# Patient Record
Sex: Male | Born: 1980 | Race: White | Hispanic: No | Marital: Married | Smoking: Never smoker
Health system: Southern US, Community
[De-identification: ages and names within clinical notes are randomized; demographics above are authoritative.]

## PROBLEM LIST (undated history)

## (undated) ENCOUNTER — Emergency Department (HOSPITAL_COMMUNITY): Admission: EM | Payer: PRIVATE HEALTH INSURANCE

## (undated) DIAGNOSIS — G43909 Migraine, unspecified, not intractable, without status migrainosus: Secondary | ICD-10-CM

## (undated) DIAGNOSIS — S8990XA Unspecified injury of unspecified lower leg, initial encounter: Secondary | ICD-10-CM

## (undated) DIAGNOSIS — I10 Essential (primary) hypertension: Secondary | ICD-10-CM

## (undated) DIAGNOSIS — I639 Cerebral infarction, unspecified: Secondary | ICD-10-CM

## (undated) DIAGNOSIS — F419 Anxiety disorder, unspecified: Secondary | ICD-10-CM

## (undated) HISTORY — DX: Essential (primary) hypertension: I10

## (undated) HISTORY — PX: OTHER SURGICAL HISTORY: SHX169

---

## 2006-06-23 ENCOUNTER — Ambulatory Visit (HOSPITAL_COMMUNITY): Payer: Self-pay | Admitting: Psychiatry

## 2006-12-15 ENCOUNTER — Ambulatory Visit (HOSPITAL_COMMUNITY): Payer: Self-pay | Admitting: Psychiatry

## 2007-05-25 ENCOUNTER — Ambulatory Visit (HOSPITAL_COMMUNITY): Payer: Self-pay | Admitting: Psychiatry

## 2008-02-29 ENCOUNTER — Ambulatory Visit (HOSPITAL_COMMUNITY): Payer: Self-pay | Admitting: Psychiatry

## 2008-08-01 ENCOUNTER — Ambulatory Visit (HOSPITAL_COMMUNITY): Payer: Self-pay | Admitting: Psychiatry

## 2009-04-24 ENCOUNTER — Ambulatory Visit (HOSPITAL_COMMUNITY): Payer: Self-pay | Admitting: Psychiatry

## 2009-07-10 ENCOUNTER — Ambulatory Visit (HOSPITAL_COMMUNITY): Payer: Self-pay | Admitting: Psychiatry

## 2009-10-09 ENCOUNTER — Ambulatory Visit (HOSPITAL_COMMUNITY): Payer: Self-pay | Admitting: Psychiatry

## 2011-05-14 ENCOUNTER — Ambulatory Visit: Payer: BC Managed Care – PPO

## 2012-07-11 ENCOUNTER — Ambulatory Visit (INDEPENDENT_AMBULATORY_CARE_PROVIDER_SITE_OTHER): Payer: BC Managed Care – PPO | Admitting: Internal Medicine

## 2012-07-11 ENCOUNTER — Ambulatory Visit: Payer: BC Managed Care – PPO

## 2012-07-11 VITALS — BP 166/100 | HR 76 | Temp 98.5°F | Resp 16 | Ht 66.5 in | Wt 189.6 lb

## 2012-07-11 DIAGNOSIS — M25512 Pain in left shoulder: Secondary | ICD-10-CM

## 2012-07-11 DIAGNOSIS — M62838 Other muscle spasm: Secondary | ICD-10-CM

## 2012-07-11 DIAGNOSIS — M25519 Pain in unspecified shoulder: Secondary | ICD-10-CM

## 2012-07-11 DIAGNOSIS — I10 Essential (primary) hypertension: Secondary | ICD-10-CM | POA: Insufficient documentation

## 2012-07-11 MED ORDER — HYDROCODONE-ACETAMINOPHEN 5-325 MG PO TABS: 1.0000 | ORAL_TABLET | Freq: Four times a day (QID) | ORAL | Status: DC | PRN

## 2012-07-11 MED ORDER — MELOXICAM 7.5 MG PO TABS: 7.5000 mg | ORAL_TABLET | Freq: Every day | ORAL | Status: DC

## 2012-07-11 MED ORDER — CYCLOBENZAPRINE HCL 5 MG PO TABS: 5.0000 mg | ORAL_TABLET | Freq: Three times a day (TID) | ORAL | Status: DC | PRN

## 2012-07-11 NOTE — Progress Notes (Signed)
   [address] [place] [address]   Phone [phone]  Subjective:    Patient ID: Edwin Roberson, male    DOB: 11/07/1980, 32 y.o.   MRN: [id number]  HPI  Pt presents to clinic with 2 day h/o shoulder pain.  He was playing dodge ball and threw the ball hard many times. Now he has pain with movement of his shoulder and when he relaxes his neck/shoulder muscles he feels like his shoulder is slipping.  Most of his pain is anterior shoulder.  Though he can move his shoulder he has a lot of pain with movement.  He has no distal paresthesias or pain.  He does have some pain in the upper part of his bicep/deltoid area.  He has noticed that the muscles in his neck and shoulder area have gotten more painful.  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  He is L handed.    Review of Systems  Musculoskeletal: Positive for myalgias. Negative for joint swelling.       Objective:   Physical Exam  Vitals reviewed. Constitutional: He is oriented to person, place, and time. He appears well-developed and well-nourished.  Pulmonary/Chest: Effort normal.  Musculoskeletal:       Left shoulder: He exhibits decreased range of motion (Slight decrease with abduction and internal rotation - pt has pain with end points of ROM), tenderness, bony tenderness, pain (anterior shoulder), spasm (trap on the L muscle spasm and TTP) and decreased strength (2nd to pain). He exhibits no swelling and normal pulse.  Neurological: He is alert and oriented to person, place, and time.  Skin: Skin is warm and dry.  Psychiatric: He has a normal mood and affect. His behavior is normal. Judgment and thought content normal.   [facility] reading (PRIMARY) by  Dr. Merla Riches. Neg      Assessment & Plan:  Pain in joint, shoulder region, left - Pt's exam is difficult due to his pain.  Will place in a sling to reduce his strain on his trap muscles and then recheck on Friday in hopes that we an  do a better exam.  I think that patient either has a significant strain or a partial rotator cuff tear.  Plan: DG Shoulder Left, meloxicam (MOBIC) 7.5 MG tablet, HYDROcodone-acetaminophen (NORCO/VICODIN) 5-325 MG per tablet  Muscle spasm - Plan: cyclobenzaprine (FLEXERIL) 5 MG tablet  He will do heat on his muscles and ice on his shoulder.    I have reviewed and agree with documentation. Robert P. Merla Riches, M.D.

## 2012-07-19 ENCOUNTER — Telehealth: Payer: Self-pay

## 2012-07-19 NOTE — Telephone Encounter (Signed)
PT STATES HE HAS SEEN SARAH FOR HIS SHOULDER AND IT IS STILL GIVING HIM A LOT OF PAIN. ALSO THE PAIN MEDICINE HE WAS GIVEN ISN'T HELPING AND WOULD LIKE TO HAVE SOMETHING ELSE CALLED IN. PLEASE CALL [phone]   [facility] ON [address]

## 2012-07-19 NOTE — Telephone Encounter (Signed)
Hydrocodone not helping shoulder pain

## 2012-07-20 ENCOUNTER — Telehealth: Payer: Self-pay | Admitting: Radiology

## 2012-07-20 DIAGNOSIS — M25512 Pain in left shoulder: Secondary | ICD-10-CM

## 2012-07-20 NOTE — Telephone Encounter (Signed)
Called patient to advise. He is asked if he wants ortho appt or to follow up here. He will come back in here this afternoon.

## 2012-07-20 NOTE — Telephone Encounter (Signed)
Patient called back, he wants to see Ortho at [facility]. Referral made.

## 2012-07-20 NOTE — Telephone Encounter (Signed)
I think a recheck would be best or a referral to ortho.

## 2012-11-03 ENCOUNTER — Other Ambulatory Visit (HOSPITAL_COMMUNITY): Payer: Self-pay | Admitting: Specialist

## 2012-11-03 DIAGNOSIS — M79662 Pain in left lower leg: Secondary | ICD-10-CM

## 2012-11-03 DIAGNOSIS — M7989 Other specified soft tissue disorders: Secondary | ICD-10-CM

## 2012-11-04 ENCOUNTER — Ambulatory Visit (HOSPITAL_COMMUNITY)
Admission: RE | Admit: 2012-11-04 | Discharge: 2012-11-04 | Disposition: A | Discharge status: Home / Self Care | Payer: BC Managed Care – PPO | Source: Ambulatory Visit | Attending: Specialist | Admitting: Specialist

## 2012-11-04 DIAGNOSIS — M7989 Other specified soft tissue disorders: Secondary | ICD-10-CM | POA: Insufficient documentation

## 2012-11-04 DIAGNOSIS — M79662 Pain in left lower leg: Secondary | ICD-10-CM

## 2012-11-04 DIAGNOSIS — M79609 Pain in unspecified limb: Secondary | ICD-10-CM

## 2012-11-04 NOTE — Progress Notes (Signed)
*  PRELIMINARY RESULTS* Vascular Ultrasound Left lower extremity venous duplex has been completed.  Preliminary findings: No evidence of DVT or SVT involving the left lower extremity and right common femoral vein. No evidence of Baker's cyst on the left. Inflamed area of the left LSV localized to the distal calf.   Timarion Agcaoili FRANCES 11/04/2012, 4:24 PM

## 2013-05-08 ENCOUNTER — Emergency Department (HOSPITAL_COMMUNITY)
Admission: EM | Admit: 2013-05-08 | Discharge: 2013-05-08 | Disposition: A | Discharge status: Home / Self Care | Payer: BC Managed Care – PPO | Source: Home / Self Care

## 2013-05-08 ENCOUNTER — Encounter (HOSPITAL_COMMUNITY): Payer: Self-pay | Admitting: Emergency Medicine

## 2013-05-08 DIAGNOSIS — K625 Hemorrhage of anus and rectum: Secondary | ICD-10-CM

## 2013-05-08 DIAGNOSIS — K5900 Constipation, unspecified: Secondary | ICD-10-CM

## 2013-05-08 DIAGNOSIS — R1084 Generalized abdominal pain: Secondary | ICD-10-CM

## 2013-05-08 NOTE — ED Provider Notes (Signed)
CSN: [id number]     Arrival date & time 05/08/13  1226 History   First MD Initiated Contact with Patient 05/08/13 1517     No chief complaint on file.  (Consider location/radiation/quality/duration/timing/severity/associated sxs/prior Treatment) HPI Comments: 32 year old male presents with blood in his stool upon wiping for the past 2 days. He has not had a good bowel movement in at least 2 days. He has been straining with a bowel movement and upon straining has seen a small amount of bright red bleeding. He does not have bleeding between stools. This is associated with intermittent abdominal pain primarily to the left and right abdomen. Not so much in the midline. No vomiting, fever, chills or diarrhea.   Past Medical History  Diagnosis Date  . Hypertension    Past Surgical History  Procedure Laterality Date  . Posterior cruciate ligament reconstruction     No family history on file. History  Substance Use Topics  . Smoking status: Never Smoker   . Smokeless tobacco: Not on file  . Alcohol Use: Not on file    Review of Systems  Constitutional: Negative.   HENT: Negative.   Respiratory: Negative.   Gastrointestinal: Positive for abdominal pain, constipation, blood in stool and anal bleeding. Negative for nausea, diarrhea and abdominal distention.       Angle irritation particularly associated with wiping post-BM.  Genitourinary: Negative.   Skin: Negative for rash.    Allergies  Review of patient's allergies indicates no known allergies.  Home Medications   Current Outpatient Rx  Name  Route  Sig  Dispense  Refill  . cyclobenzaprine (FLEXERIL) 5 MG tablet   Oral   Take 1 tablet (5 mg total) by mouth 3 (three) times daily as needed for muscle spasms.   30 tablet   0   . HYDROcodone-acetaminophen (NORCO/VICODIN) 5-325 MG per tablet   Oral   Take 1 tablet by mouth every 6 (six) hours as needed for pain.   30 tablet   0   . meloxicam (MOBIC) 7.5 MG tablet   Oral  Take 1 tablet (7.5 mg total) by mouth daily.   30 tablet   0   . valsartan-hydrochlorothiazide (DIOVAN-HCT) 320-25 MG per tablet   Oral   Take 1 tablet by mouth daily.         Marland Kitchen zolpidem (AMBIEN) 10 MG tablet   Oral   Take 10 mg by mouth at bedtime as needed for sleep.          BP 155/112  Pulse 73  Temp(Src) 98.7 F (37.1 C) (Oral)  Resp 16  SpO2 100% Physical Exam  Nursing note and vitals reviewed. Constitutional: He is oriented to person, place, and time. He appears well-developed and well-nourished. No distress.  Neck: Normal range of motion. Neck supple.  Cardiovascular: Normal rate and regular rhythm.   Pulmonary/Chest: Effort normal and breath sounds normal. No respiratory distress.  Abdominal: Soft. He exhibits no distension.  Minor tenderness along the ascending and descending colon. Palpation reveals small masses suspected of being fecal matter. His abdomen is flat and easily palpated. Percussion reveals dull and flat areas. There is no tympany.  Genitourinary: Prostate normal. Guaiac positive stool.  Very minor and light erythema to the perianal tissues. DRE reveals no palpable masses or irregular structures. Patient denies pain associated with the exam. No stool in the rectal vault. The rectal mucus associated with the exam was tested for blood and was grossly positive.  Musculoskeletal: He exhibits no  edema.  Neurological: He is alert and oriented to person, place, and time. A cranial nerve deficit is present. He exhibits normal muscle tone.  Skin: Skin is warm and dry.  Psychiatric: He has a normal mood and affect.    ED Course  Procedures (including critical care time) Labs Review Labs Reviewed - No data to display Imaging Review No results found.    MDM   1. Abdominal pain, generalized   2. Constipation   3. Bright red rectal bleeding      No acute abdomen. Dulcolax  as directed this afternoon and start MiraLax at least 3 glasses of 17 g each  over the next 3 hours. Drink plenty of fluids. Increase fiber content in her diet. Followup with your PCP. For any symptoms problems or worsening sig medical attention promptly.  Hayden Rasmussen, NP 05/08/13 1547  Hayden Rasmussen, NP 05/08/13 1550

## 2013-05-09 NOTE — ED Provider Notes (Signed)
Medical screening examination/treatment/procedure(s) were performed by non-physician practitioner and as supervising physician I was immediately available for consultation/collaboration.  Kolleen Ochsner, M.D.  Sherronda Sweigert C Jovan Colligan, MD 05/09/13 0035 

## 2013-05-22 ENCOUNTER — Ambulatory Visit: Payer: BC Managed Care – PPO

## 2013-05-22 ENCOUNTER — Ambulatory Visit (INDEPENDENT_AMBULATORY_CARE_PROVIDER_SITE_OTHER): Payer: BC Managed Care – PPO | Admitting: Family Medicine

## 2013-05-22 VITALS — BP 147/90 | HR 76 | Temp 98.7°F | Resp 16 | Ht 68.0 in | Wt 193.0 lb

## 2013-05-22 DIAGNOSIS — R0781 Pleurodynia: Secondary | ICD-10-CM

## 2013-05-22 DIAGNOSIS — W102XXA Fall (on)(from) incline, initial encounter: Secondary | ICD-10-CM

## 2013-05-22 DIAGNOSIS — R079 Chest pain, unspecified: Secondary | ICD-10-CM

## 2013-05-22 DIAGNOSIS — M25519 Pain in unspecified shoulder: Secondary | ICD-10-CM

## 2013-05-22 DIAGNOSIS — S29011A Strain of muscle and tendon of front wall of thorax, initial encounter: Secondary | ICD-10-CM

## 2013-05-22 DIAGNOSIS — IMO0002 Reserved for concepts with insufficient information to code with codable children: Secondary | ICD-10-CM

## 2013-05-22 DIAGNOSIS — M25512 Pain in left shoulder: Secondary | ICD-10-CM

## 2013-05-22 MED ORDER — HYDROCODONE-ACETAMINOPHEN 5-325 MG PO TABS: 1.0000 | ORAL_TABLET | Freq: Four times a day (QID) | ORAL | Status: DC | PRN

## 2013-05-22 NOTE — Patient Instructions (Signed)
Take Aleve 2 pills twice daily breakfast and supper  Take the hydrocodone if needed for severe pain  Return if not improving or if worse  Apply heat or warm compresses or a warm bath for some symptomatic relief

## 2013-05-22 NOTE — Progress Notes (Signed)
Subjective: Patient was helping his father moved 2 days ago. He took a short cut down a bank,, his feet slipped out from under him, he hit backwards hard to, and bounced back up to a standing position because of the incline he was on. He felt okay at that point. As the day went on he continued to develop more pain in the right posterior ribs and around to his side. It seems to start almost in the spine and moved to the right. Hurt his left ribs years ago, but this was something new. He is currently unemployed, having been laid off a couple weeks ago.  Objective: Alert and oriented. Mildly overweight. Chest clear. Mild tenderness on lower thoracic spine. Tender right posterior rib cage about 3 inches out from the spine seems to be the area of most tenderness. There is some tenderness around the right chest wall. Abdomen soft without mass or tenderness. Range of motion of his back is good though he gets more pain with tilting of the right or truncal rotation strain right chest wall. Adequate flexion though it hurt some when he was standing back up.  Assessment: Chest wall pain Fall on incline, initial visit  Plan: Right rib series  [facility] reading (PRIMARY) by  Dr. Alwyn RenHopper Normal ribs  Chest wall strain. .  Will treat with pain medication and anti-inflammatory medication

## 2013-10-09 ENCOUNTER — Encounter (HOSPITAL_COMMUNITY): Payer: Self-pay | Admitting: Emergency Medicine

## 2013-10-09 ENCOUNTER — Emergency Department (HOSPITAL_COMMUNITY)
Admission: EM | Admit: 2013-10-09 | Discharge: 2013-10-09 | Disposition: A | Discharge status: Home / Self Care | Payer: BC Managed Care – PPO | Attending: Emergency Medicine | Admitting: Emergency Medicine

## 2013-10-09 ENCOUNTER — Emergency Department (HOSPITAL_COMMUNITY): Payer: BC Managed Care – PPO

## 2013-10-09 DIAGNOSIS — F419 Anxiety disorder, unspecified: Secondary | ICD-10-CM

## 2013-10-09 DIAGNOSIS — Z760 Encounter for issue of repeat prescription: Secondary | ICD-10-CM

## 2013-10-09 DIAGNOSIS — I10 Essential (primary) hypertension: Secondary | ICD-10-CM | POA: Insufficient documentation

## 2013-10-09 DIAGNOSIS — Z79899 Other long term (current) drug therapy: Secondary | ICD-10-CM | POA: Insufficient documentation

## 2013-10-09 DIAGNOSIS — R110 Nausea: Secondary | ICD-10-CM | POA: Insufficient documentation

## 2013-10-09 DIAGNOSIS — F411 Generalized anxiety disorder: Secondary | ICD-10-CM | POA: Insufficient documentation

## 2013-10-09 DIAGNOSIS — R197 Diarrhea, unspecified: Secondary | ICD-10-CM | POA: Insufficient documentation

## 2013-10-09 HISTORY — DX: Anxiety disorder, unspecified: F41.9

## 2013-10-09 LAB — COMPREHENSIVE METABOLIC PANEL
ALK PHOS: 47 U/L (ref 39–117)
ALT: 37 U/L (ref 0–53)
AST: 20 U/L (ref 0–37)
Albumin: 4.4 g/dL (ref 3.5–5.2)
BUN: 12 mg/dL (ref 6–23)
CALCIUM: 9.8 mg/dL (ref 8.4–10.5)
CO2: 23 mEq/L (ref 19–32)
Chloride: 98 mEq/L (ref 96–112)
Creatinine, Ser: 1.02 mg/dL (ref 0.50–1.35)
GFR calc non Af Amer: >90 mL/min (ref >90)
GLUCOSE: 105 mg/dL — AB (ref 70–99)
POTASSIUM: 3.9 meq/L (ref 3.7–5.3)
SODIUM: 137 meq/L (ref 137–147)
Total Bilirubin: 0.6 mg/dL (ref 0.3–1.2)
Total Protein: 8.1 g/dL (ref 6.0–8.3)

## 2013-10-09 LAB — CBC WITH DIFFERENTIAL/PLATELET
BASOS ABS: 0.1 10*3/uL (ref 0.0–0.1)
Basophils Relative: 1 % (ref 0–1)
EOS PCT: 0 % (ref 0–5)
Eosinophils Absolute: 0 10*3/uL (ref 0.0–0.7)
HCT: 49.3 % (ref 39.0–52.0)
Hemoglobin: 17.4 g/dL — ABNORMAL HIGH (ref 13.0–17.0)
LYMPHS ABS: 2.2 10*3/uL (ref 0.7–4.0)
LYMPHS PCT: 17 % (ref 12–46)
MCH: 30.1 pg (ref 26.0–34.0)
MCHC: 35.3 g/dL (ref 30.0–36.0)
MCV: 85.3 fL (ref 78.0–100.0)
Monocytes Absolute: 0.7 10*3/uL (ref 0.1–1.0)
Monocytes Relative: 5 % (ref 3–12)
NEUTROS PCT: 77 % (ref 43–77)
Neutro Abs: 10.1 10*3/uL — ABNORMAL HIGH (ref 1.7–7.7)
PLATELETS: 312 10*3/uL (ref 150–400)
RBC: 5.78 MIL/uL (ref 4.22–5.81)
RDW: 12.8 % (ref 11.5–15.5)
WBC: 13.1 10*3/uL — AB (ref 4.0–10.5)

## 2013-10-09 LAB — I-STAT TROPONIN, ED: Troponin i, poc: 0 ng/mL (ref 0.00–0.08)

## 2013-10-09 LAB — LIPASE, BLOOD: Lipase: 21 U/L (ref 11–59)

## 2013-10-09 MED ORDER — ONDANSETRON 4 MG PO TBDP: 4.0000 mg | ORAL_TABLET | Freq: Three times a day (TID) | ORAL | Status: DC | PRN

## 2013-10-09 MED ORDER — ONDANSETRON 4 MG PO TBDP
4.0000 mg | ORAL_TABLET | Freq: Once | ORAL | Status: AC
  Administered 2013-10-09: 4 mg via ORAL
  Filled 2013-10-09: qty 1

## 2013-10-09 MED ORDER — ALPRAZOLAM 0.5 MG PO TABS: 0.5000 mg | ORAL_TABLET | Freq: Three times a day (TID) | ORAL | Status: DC | PRN

## 2013-10-09 NOTE — ED Notes (Signed)
Patient transported to X-ray 

## 2013-10-09 NOTE — ED Notes (Signed)
Per pt he has been having panic attacks and anxiety since Friday. sts he is out of his medication. Has appt to see doctor tomorrow.

## 2013-10-09 NOTE — ED Provider Notes (Signed)
CSN: [id number]     Arrival date & time 10/09/13  8101 History   First MD Initiated Contact with Patient 10/09/13 0830     Chief Complaint  Patient presents with  . Panic Attack     (Consider location/radiation/quality/duration/timing/severity/associated sxs/prior Treatment) The history is provided by the patient and medical records.   This is a 33 year old male with past history significant for hypertension and anxiety, presenting to the ED for increased anxiety for the past 3 days. Patient states he takes Xanax regularly for his anxiety, has been on this for several years but about his medication on Friday. He states he is not anxious or nervous about anything specific.  He denies any new stressors in his life. His only complaint is that he feels like he has no "get up and go" about him with some paresthesias and sweating of bilateral hands.  States these symptoms are typical of his panic attacks.  He also admits to some nausea and nonbloody diarrhea. He denies any recent sick contacts or antibiotic use. No fevers or chills. No vomiting.  He denies any chest pain, SOB, palpitations, dizziness, lightheadedness, numbness, or weakness of extremities.    No EtOH or illicit drug use.  Pt denies SI/HI/AVH.  Patient states he has a followup appointment with his psychiatrist this week-- had to change providers which is why his prescription lapsed.  VS stable on arrival.  Past Medical History  Diagnosis Date  . Hypertension   . Anxiety    Past Surgical History  Procedure Laterality Date  . Posterior cruciate ligament reconstruction     Family History  Problem Relation Age of Onset  . Heart disease Father    History  Substance Use Topics  . Smoking status: Never Smoker   . Smokeless tobacco: Not on file  . Alcohol Use: Yes    Review of Systems  Gastrointestinal: Positive for nausea and diarrhea.  Psychiatric/Behavioral: The patient is nervous/anxious.   All other systems reviewed and are  negative.     Allergies  Review of patient's allergies indicates no known allergies.  Home Medications   Prior to Admission medications   Medication Sig Start Date End Date Taking? Authorizing Provider  ALPRAZolam Prudy Feeler) 0.5 MG tablet Take 0.5 mg by mouth 3 (three) times daily as needed for anxiety.   Yes Historical Provider, MD  citalopram (CELEXA) 40 MG tablet Take 40 mg by mouth daily.   Yes Historical Provider, MD  hydrOXYzine (ATARAX/VISTARIL) 50 MG tablet Take 50 mg by mouth every 6 (six) hours as needed for anxiety.   Yes Historical Provider, MD  losartan-hydrochlorothiazide (HYZAAR) 100-25 MG per tablet Take 1 tablet by mouth daily.   Yes Historical Provider, MD   BP 145/83  Pulse 91  Temp(Src) 98.9 F (37.2 C) (Oral)  Resp 14  Wt 195 lb (88.451 kg)  SpO2 95%  Physical Exam  Nursing note and vitals reviewed. Constitutional: He is oriented to person, place, and time. He appears well-developed and well-nourished. No distress.  HENT:  Head: Normocephalic and atraumatic.  Mouth/Throat: Oropharynx is clear and moist.  Eyes: Conjunctivae and EOM are normal. Pupils are equal, round, and reactive to light.  Neck: Normal range of motion. Neck supple.  Cardiovascular: Normal rate, regular rhythm and normal heart sounds.   Pulmonary/Chest: Effort normal and breath sounds normal. No respiratory distress. He has no wheezes.  Abdominal: Soft. Bowel sounds are normal. There is no tenderness. There is no guarding.  Abdomen soft, non-distended, no peritoneal  signs  Musculoskeletal: Normal range of motion.  Neurological: He is alert and oriented to person, place, and time. He has normal strength. He displays no tremor. No cranial nerve deficit or sensory deficit. He displays no seizure activity.  AAOx3, answering questions appropriately; equal strength UE and LE bilaterally; CN grossly intact; moves all extremities appropriately without ataxia; no focal neuro deficits or facial  asymmetry appreciated  Skin: Skin is warm and dry. He is not diaphoretic.  Psychiatric: He has a normal mood and affect.    ED Course  Procedures (including critical care time) Labs Review Labs Reviewed  CBC WITH DIFFERENTIAL - Abnormal; Notable for the following:    WBC 13.1 (*)    Hemoglobin 17.4 (*)    Neutro Abs 10.1 (*)    All other components within normal limits  COMPREHENSIVE METABOLIC PANEL - Abnormal; Notable for the following:    Glucose, Bld 105 (*)    All other components within normal limits  LIPASE, BLOOD  I-STAT TROPOININ, ED    Imaging Review Dg Abd Acute W/chest  10/09/2013   CLINICAL DATA:  Pain and diarrhea.  EXAM: ACUTE ABDOMEN SERIES (ABDOMEN 2 VIEW & CHEST 1 VIEW)  COMPARISON:  None.  FINDINGS: There is no evidence of dilated bowel loops or free intraperitoneal air. No radiopaque calculi or other significant radiographic abnormality is seen. Heart size and mediastinal contours are within normal limits. Both lungs are clear.  IMPRESSION: Negative abdominal radiographs.  No acute cardiopulmonary disease.   Electronically Signed   By: Maisie Fushomas  Register   On: 10/09/2013 09:12     EKG Interpretation   Date/Time:  Monday October 09 2013 08:54:08 EDT Ventricular Rate:  85 PR Interval:  138 QRS Duration: 83 QT Interval:  371 QTC Calculation: 441 R Axis:   68 Text Interpretation:  Sinus rhythm Baseline wander in lead(s) V3 No old  tracing to compare Confirmed by JACUBOWITZ  MD, SAM [phone]([id number]) on 10/09/2013  9:06:28 AM      MDM   Final diagnoses:  Diarrhea  Anxiety  Medication refill   33 y.o. M with hx of anxiety presenting with increased feelings of anxiety x 3 days.  Denies SI/HI/AVH.  Pt ran out of his xanax on Friday.  Since then has had paresthesias and increased sweating of bilateral hands which he states is typical of his panic attacks. He does note he has had diarrhea and nausea for the past 3 days. On exam he is afebrile and overall nontoxic appearing.  His abdominal exam is benign. He is no focal neurologic deficits and I do not feel his symptoms are due to TIA, stroke, ICH, SAH or meningitis.  Will obtain EKG, labs, acute abdominal series.  zofran given for nausea.  EKG sinus rhythm without ischemic change. Labs are reassuring. Acute abdominal series negative for acute findings in chest or abdomen. After Zofran, patient states his nausea has probably resolved. He has tolerated PO soda in the ED without difficulty.  Abdominal exam remains benign.  Pt remains neurologically intact.  He will be discharged home with a short supply of his regular Xanax given until his FU appt later this week with his psychiatrist.  Discussed plan with patient, he/she acknowledged understanding and agreed with plan of care.  Return precautions given for new or worsening symptoms.  Garlon HatchetLisa M Sanders, PA-C 10/09/13 1306

## 2013-10-09 NOTE — Discharge Instructions (Signed)
Take the prescribed medication as directed. Follow-up with your psychiatrist this week for your routine medication refills. Return to the ED for new or worsening symptoms.

## 2013-10-09 NOTE — ED Provider Notes (Signed)
Medical screening examination/treatment/procedure(s) were performed by non-physician practitioner and as supervising physician I was immediately available for consultation/collaboration.   EKG Interpretation   Date/Time:  Monday October 09 2013 08:54:08 EDT Ventricular Rate:  85 PR Interval:  138 QRS Duration: 83 QT Interval:  371 QTC Calculation: 441 R Axis:   68 Text Interpretation:  Sinus rhythm Baseline wander in lead(s) V3 No old  tracing to compare Confirmed by Ethelda Chick  MD, Lovis More [phone]) on 10/09/2013  9:06:28 AM       Doug Sou, MD 10/09/13 1540

## 2014-02-14 ENCOUNTER — Encounter (HOSPITAL_BASED_OUTPATIENT_CLINIC_OR_DEPARTMENT_OTHER): Payer: Self-pay | Admitting: Emergency Medicine

## 2014-02-14 ENCOUNTER — Emergency Department (HOSPITAL_BASED_OUTPATIENT_CLINIC_OR_DEPARTMENT_OTHER)
Admission: EM | Admit: 2014-02-14 | Discharge: 2014-02-14 | Disposition: A | Discharge status: Home / Self Care | Payer: PRIVATE HEALTH INSURANCE | Attending: Emergency Medicine | Admitting: Emergency Medicine

## 2014-02-14 ENCOUNTER — Emergency Department (HOSPITAL_BASED_OUTPATIENT_CLINIC_OR_DEPARTMENT_OTHER): Payer: PRIVATE HEALTH INSURANCE

## 2014-02-14 DIAGNOSIS — S46011A Strain of muscle(s) and tendon(s) of the rotator cuff of right shoulder, initial encounter: Secondary | ICD-10-CM | POA: Insufficient documentation

## 2014-02-14 DIAGNOSIS — S46911A Strain of unspecified muscle, fascia and tendon at shoulder and upper arm level, right arm, initial encounter: Secondary | ICD-10-CM

## 2014-02-14 DIAGNOSIS — I10 Essential (primary) hypertension: Secondary | ICD-10-CM | POA: Diagnosis not present

## 2014-02-14 DIAGNOSIS — Y9364 Activity, baseball: Secondary | ICD-10-CM | POA: Insufficient documentation

## 2014-02-14 DIAGNOSIS — Z79899 Other long term (current) drug therapy: Secondary | ICD-10-CM | POA: Diagnosis not present

## 2014-02-14 DIAGNOSIS — F419 Anxiety disorder, unspecified: Secondary | ICD-10-CM | POA: Insufficient documentation

## 2014-02-14 DIAGNOSIS — Y92320 Baseball field as the place of occurrence of the external cause: Secondary | ICD-10-CM | POA: Insufficient documentation

## 2014-02-14 DIAGNOSIS — S4991XA Unspecified injury of right shoulder and upper arm, initial encounter: Secondary | ICD-10-CM | POA: Diagnosis present

## 2014-02-14 DIAGNOSIS — W1830XA Fall on same level, unspecified, initial encounter: Secondary | ICD-10-CM | POA: Diagnosis not present

## 2014-02-14 MED ORDER — IBUPROFEN 800 MG PO TABS
800.0000 mg | ORAL_TABLET | Freq: Once | ORAL | Status: AC
  Administered 2014-02-14: 800 mg via ORAL
  Filled 2014-02-14: qty 1

## 2014-02-14 MED ORDER — IBUPROFEN 800 MG PO TABS: 800.0000 mg | ORAL_TABLET | Freq: Three times a day (TID) | ORAL | Status: DC

## 2014-02-14 MED ORDER — HYDROCODONE-ACETAMINOPHEN 5-325 MG PO TABS
1.0000 | ORAL_TABLET | ORAL | Status: DC | PRN
Start: 2014-02-14 — End: 2014-07-07

## 2014-02-14 NOTE — ED Provider Notes (Signed)
CSN: [id number]     Arrival date & time 02/14/14  2117 History   First MD Initiated Contact with Patient 02/14/14 2211     Chief Complaint  Patient presents with  . Shoulder Injury     (Consider location/radiation/quality/duration/timing/severity/associated sxs/prior Treatment) Patient is a 33 y.o. male presenting with shoulder injury. The history is provided by the patient. No language interpreter was used.  Shoulder Injury This is a new problem. The current episode started today. The problem occurs constantly. Pertinent negatives include no abdominal pain, chest pain, chills, fever or neck pain. Associated symptoms comments: Right shoulder injury while playing softball earlier tonight. He fell while trying to catch a ball with right arm fully abducted and has had worsening deltoid shoulder pain since. No other injury..    Past Medical History  Diagnosis Date  . Hypertension   . Anxiety    Past Surgical History  Procedure Laterality Date  . Posterior cruciate ligament reconstruction     Family History  Problem Relation Age of Onset  . Heart disease Father    History  Substance Use Topics  . Smoking status: Never Smoker   . Smokeless tobacco: Not on file  . Alcohol Use: Yes    Review of Systems  Constitutional: Negative for fever and chills.  Respiratory: Negative.  Negative for shortness of breath.   Cardiovascular: Negative.  Negative for chest pain.  Gastrointestinal: Negative.  Negative for abdominal pain.  Musculoskeletal: Negative for back pain and neck pain.       See HPI  Skin: Negative.   Neurological: Negative.       Allergies  Review of patient's allergies indicates no known allergies.  Home Medications   Prior to Admission medications   Medication Sig Start Date End Date Taking? Authorizing Provider  ALPRAZolam Prudy Feeler) 0.5 MG tablet Take 0.5 mg by mouth 3 (three) times daily as needed for anxiety.    Historical Provider, MD  ALPRAZolam Prudy Feeler) 0.5  MG tablet Take 1 tablet (0.5 mg total) by mouth 3 (three) times daily as needed for anxiety. 10/09/13   Garlon Hatchet, PA-C  citalopram (CELEXA) 40 MG tablet Take 40 mg by mouth daily.    Historical Provider, MD  hydrOXYzine (ATARAX/VISTARIL) 50 MG tablet Take 50 mg by mouth every 6 (six) hours as needed for anxiety.    Historical Provider, MD  losartan-hydrochlorothiazide (HYZAAR) 100-25 MG per tablet Take 1 tablet by mouth daily.    Historical Provider, MD  ondansetron (ZOFRAN ODT) 4 MG disintegrating tablet Take 1 tablet (4 mg total) by mouth every 8 (eight) hours as needed for nausea. 10/09/13   Garlon Hatchet, PA-C   BP 171/114  Pulse 107  Temp(Src) 98.2 F (36.8 C) (Oral)  Resp 18  Ht 5\' 7"  (1.702 m)  Wt 190 lb (86.183 kg)  BMI 29.75 kg/m2 Physical Exam  Constitutional: He is oriented to person, place, and time. He appears well-developed and well-nourished.  Neck: Normal range of motion.  Cardiovascular: Intact distal pulses.   Pulmonary/Chest: Effort normal. He exhibits no tenderness.  Abdominal: There is no tenderness.  Musculoskeletal: Normal range of motion.  Right shoulder tenderness over deltoid extending to [facility]. No bony deformity visualized. Full strength of biceps/triceps and grip. No midline or paracervical neck tenderness.   Neurological: He is alert and oriented to person, place, and time. Coordination normal.  Skin: Skin is warm and dry.  Psychiatric: He has a normal mood and affect.    ED Course  Procedures (including critical care time) Labs Review Labs Reviewed - No data to display  Imaging Review Dg Shoulder Right  02/14/2014   CLINICAL DATA:  Injury to right shoulder while playing softball. Slid onto base, with acute onset of right shoulder pain. Initial encounter.  EXAM: RIGHT SHOULDER - 2+ VIEW  COMPARISON:  None.  FINDINGS: There is no evidence of fracture or dislocation. The right humeral head is seated within the glenoid fossa. The acromioclavicular joint is  unremarkable in appearance. No significant soft tissue abnormalities are seen. The visualized portions of the right lung are clear.  IMPRESSION: No evidence of fracture or dislocation.   Electronically Signed   By: Roanna RaiderJeffery  Chang M.D.   On: 02/14/2014 22:02     EKG Interpretation None      MDM   Final diagnoses:  None    1. Right shoulder strain  Negative imaging for bony injury, including AC joint abnormalities. Suspect strain injury. Will refer to his orthopedist for any persistent pain.    Arnoldo HookerShari A Fallou Hulbert, PA-C 02/14/14 2252

## 2014-02-14 NOTE — ED Notes (Signed)
Pt reports injuring his (R) shoulder playing baseball tonight.

## 2014-02-14 NOTE — Discharge Instructions (Signed)
Cryotherapy °Cryotherapy means treatment with cold. Ice or gel packs can be used to reduce both pain and swelling. Ice is the most helpful within the first 24 to 48 hours after an injury or flare-up from overusing a muscle or joint. Sprains, strains, spasms, burning pain, shooting pain, and aches can all be eased with ice. Ice can also be used when recovering from surgery. Ice is effective, has very few side effects, and is safe for most people to use. °PRECAUTIONS  °Ice is not a safe treatment option for people with: °· Raynaud phenomenon. This is a condition affecting small blood vessels in the extremities. Exposure to cold may cause your problems to return. °· Cold hypersensitivity. There are many forms of cold hypersensitivity, including: °¨ Cold urticaria. Red, itchy hives appear on the skin when the tissues begin to warm after being iced. °¨ Cold erythema. This is a red, itchy rash caused by exposure to cold. °¨ Cold hemoglobinuria. Red blood cells break down when the tissues begin to warm after being iced. The hemoglobin that carry oxygen are passed into the urine because they cannot combine with blood proteins fast enough. °· Numbness or altered sensitivity in the area being iced. °If you have any of the following conditions, do not use ice until you have discussed cryotherapy with your caregiver: °· Heart conditions, such as arrhythmia, angina, or chronic heart disease. °· High blood pressure. °· Healing wounds or open skin in the area being iced. °· Current infections. °· Rheumatoid arthritis. °· Poor circulation. °· Diabetes. °Ice slows the blood flow in the region it is applied. This is beneficial when trying to stop inflamed tissues from spreading irritating chemicals to surrounding tissues. However, if you expose your skin to cold temperatures for too long or without the proper protection, you can damage your skin or nerves. Watch for signs of skin damage due to cold. °HOME CARE INSTRUCTIONS °Follow  these tips to use ice and cold packs safely. °· Place a dry or damp towel between the ice and skin. A damp towel will cool the skin more quickly, so you may need to shorten the time that the ice is used. °· For a more rapid response, add gentle compression to the ice. °· Ice for no more than 10 to 20 minutes at a time. The bonier the area you are icing, the less time it will take to get the benefits of ice. °· Check your skin after 5 minutes to make sure there are no signs of a poor response to cold or skin damage. °· Rest 20 minutes or more between uses. °· Once your skin is numb, you can end your treatment. You can test numbness by very lightly touching your skin. The touch should be so light that you do not see the skin dimple from the pressure of your fingertip. When using ice, most people will feel these normal sensations in this order: cold, burning, aching, and numbness. °· Do not use ice on someone who cannot communicate their responses to pain, such as small children or people with dementia. °HOW TO MAKE AN ICE PACK °Ice packs are the most common way to use ice therapy. Other methods include ice massage, ice baths, and cryosprays. Muscle creams that cause a cold, tingly feeling do not offer the same benefits that ice offers and should not be used as a substitute unless recommended by your caregiver. °To make an ice pack, do one of the following: °· Place crushed ice or a   bag of frozen vegetables in a sealable plastic bag. Squeeze out the excess air. Place this bag inside another plastic bag. Slide the bag into a pillowcase or place a damp towel between your skin and the bag. °· Mix 3 parts water with 1 part rubbing alcohol. Freeze the mixture in a sealable plastic bag. When you remove the mixture from the freezer, it will be slushy. Squeeze out the excess air. Place this bag inside another plastic bag. Slide the bag into a pillowcase or place a damp towel between your skin and the bag. °SEEK MEDICAL CARE  IF: °· You develop white spots on your skin. This may give the skin a blotchy (mottled) appearance. °· Your skin turns blue or pale. °· Your skin becomes waxy or hard. °· Your swelling gets worse. °MAKE SURE YOU:  °· Understand these instructions. °· Will watch your condition. °· Will get help right away if you are not doing well or get worse. °Document Released: 12/22/2010 Document Revised: 09/11/2013 Document Reviewed: 12/22/2010 °ExitCare® Patient Information ©2015 [employer]. This information is not intended to replace advice given to you by your health care provider. Make sure you discuss any questions you have with your health care provider. °Shoulder Sprain °A shoulder sprain is the result of damage to the tough, fiber-like tissues (ligaments) that help hold your shoulder in place. The ligaments may be stretched or torn. Besides the main shoulder joint (the ball and socket), there are several smaller joints that connect the bones in this area. A sprain usually involves one of those joints. Most often it is the acromioclavicular (or AC) joint. That is the joint that connects the collarbone (clavicle) and the shoulder blade (scapula) at the top point of the shoulder blade (acromion). °A shoulder sprain is a mild form of what is called a shoulder separation. Recovering from a shoulder sprain may take some time. For some, pain lingers for several months. Most people recover without long term problems. °CAUSES  °· A shoulder sprain is usually caused by some kind of trauma. This might be: °¨ Falling on an outstretched arm. °¨ Being hit hard on the shoulder. °¨ Twisting the arm. °· Shoulder sprains are more likely to occur in people who: °¨ Play sports. °¨ Have balance or coordination problems. °SYMPTOMS  °· Pain when you move your shoulder. °· Limited ability to move the shoulder. °· Swelling and tenderness on top of the shoulder. °· Redness or warmth in the shoulder. °· Bruising. °· A change in the shape of the  shoulder. °DIAGNOSIS  °Your healthcare provider may: °· Ask about your symptoms. °· Ask about recent activity that might have caused those symptoms. °· Examine your shoulder. You may be asked to do simple exercises to test movement. The other shoulder will be examined for comparison. °· Order some tests that provide a look inside the body. They can show the extent of the injury. The tests could include: °¨ X-rays. °¨ CT (computed tomography) scan. °¨ MRI (magnetic resonance imaging) scan. °RISKS AND COMPLICATIONS °· Loss of full shoulder motion. °· Ongoing shoulder pain. °TREATMENT  °How long it takes to recover from a shoulder sprain depends on how severe it was. Treatment options may include: °· Rest. You should not use the arm or shoulder until it heals. °· Ice. For 2 or 3 days after the injury, put an ice pack on the shoulder up to 4 times a day. It should stay on for 15 to 20 minutes each time. Wrap   the ice in a towel so it does not touch your skin.  Over-the-counter medicine to relieve pain.  A sling or brace. This will keep the arm still while the shoulder is healing.  Physical therapy or rehabilitation exercises. These will help you regain strength and motion. Ask your healthcare provider when it is OK to begin these exercises.  Surgery. The need for surgery is rare with a sprained shoulder, but some people may need surgery to keep the joint in place and reduce pain. HOME CARE INSTRUCTIONS   Ask your healthcare provider about what you should and should not do while your shoulder heals.  Make sure you know how to apply ice to the correct area of your shoulder.  Talk with your healthcare provider about which medications should be used for pain and swelling.  If rehabilitation therapy will be needed, ask your healthcare provider to refer you to a therapist. If it is not recommended, then ask about at-home exercises. Find out when exercise should begin. SEEK MEDICAL CARE IF:  Your pain,  swelling, or redness at the joint increases. SEEK IMMEDIATE MEDICAL CARE IF:   You have a fever.  You cannot move your arm or shoulder. Document Released: 09/13/2008 Document Revised: 07/20/2011 Document Reviewed: 09/13/2008 [facility] Patient Information 2015 [place], [place]. This information is not intended to replace advice given to you by your health care provider. Make sure you discuss any questions you have with your health care provider.

## 2014-02-15 NOTE — ED Provider Notes (Signed)
Medical screening examination/treatment/procedure(s) were performed by non-physician practitioner and as supervising physician I was immediately available for consultation/collaboration.   EKG Interpretation None        Tauni Sanks T Roseline Ebarb, MD 02/15/14 1607 

## 2014-03-11 HISTORY — PX: SHOULDER SURGERY: SHX246

## 2014-04-30 ENCOUNTER — Emergency Department (HOSPITAL_COMMUNITY)
Admission: EM | Admit: 2014-04-30 | Discharge: 2014-04-30 | Disposition: A | Discharge status: Home / Self Care | Payer: PRIVATE HEALTH INSURANCE | Attending: Emergency Medicine | Admitting: Emergency Medicine

## 2014-04-30 ENCOUNTER — Encounter (HOSPITAL_COMMUNITY): Payer: Self-pay | Admitting: *Deleted

## 2014-04-30 DIAGNOSIS — I10 Essential (primary) hypertension: Secondary | ICD-10-CM | POA: Insufficient documentation

## 2014-04-30 DIAGNOSIS — Z760 Encounter for issue of repeat prescription: Secondary | ICD-10-CM | POA: Diagnosis present

## 2014-04-30 DIAGNOSIS — Z791 Long term (current) use of non-steroidal anti-inflammatories (NSAID): Secondary | ICD-10-CM | POA: Diagnosis not present

## 2014-04-30 DIAGNOSIS — Z79899 Other long term (current) drug therapy: Secondary | ICD-10-CM | POA: Insufficient documentation

## 2014-04-30 DIAGNOSIS — F419 Anxiety disorder, unspecified: Secondary | ICD-10-CM | POA: Diagnosis not present

## 2014-04-30 MED ORDER — LORAZEPAM 1 MG PO TABS: 1.0000 mg | ORAL_TABLET | Freq: Three times a day (TID) | ORAL | Status: DC | PRN

## 2014-04-30 NOTE — ED Notes (Addendum)
PT reports he has called the PCP office Acie FredricksonLesley O'Neal and refill not offered.The office offered another med but Pt did not want it. Pt reports last dose was WED 04-25-14

## 2014-04-30 NOTE — ED Provider Notes (Signed)
CSN: [id number]     Arrival date & time 04/30/14  1102 History  This chart was scribed for non-physician practitioner, Emilia BeckKaitlyn Ashely Goosby, PA-C working with Juliet RudeNathan R. Rubin PayorPickering, MD by Freida Busmaniana Omoyeni, ED Scribe. This patient was seen in room TR07C/TR07C and the patient's care was started at 12:16 PM.     Chief Complaint  Patient presents with  . Medication Refill  . Anxiety      The history is provided by the patient and the spouse. No language interpreter was used.     HPI Comments:  Kerney ElbeChristopher Weight is a 33 y.o. male with a h/o anxiety who presents to the Emergency Department complaining of anxiety and "shaking" after being taken off of xanax 2 weeks ago by PCP. Pt was taking 0.5 mg Xanax three times a day for the past 8 years. Pt was placed on vistaril which he has been taking for about 1 week without relief. Pt's wife also notes an increase in BP since being taken off of the xanax.   Past Medical History  Diagnosis Date  . Hypertension   . Anxiety    Past Surgical History  Procedure Laterality Date  . Posterior cruciate ligament reconstruction     Family History  Problem Relation Age of Onset  . Heart disease Father    History  Substance Use Topics  . Smoking status: Never Smoker   . Smokeless tobacco: Not on file  . Alcohol Use: Yes    Review of Systems  Constitutional: Negative for fever and chills.  Respiratory: Negative for shortness of breath.   Cardiovascular: Negative for chest pain.  Psychiatric/Behavioral: The patient is nervous/anxious.   All other systems reviewed and are negative.     Allergies  Review of patient's allergies indicates no known allergies.  Home Medications   Prior to Admission medications   Medication Sig Start Date End Date Taking? Authorizing Provider  ALPRAZolam Prudy Feeler(XANAX) 0.5 MG tablet Take 0.5 mg by mouth 3 (three) times daily as needed for anxiety.    Historical Provider, MD  ALPRAZolam Prudy Feeler(XANAX) 0.5 MG tablet Take 1 tablet  (0.5 mg total) by mouth 3 (three) times daily as needed for anxiety. 10/09/13   Garlon HatchetLisa M Sanders, PA-C  citalopram (CELEXA) 40 MG tablet Take 40 mg by mouth daily.    Historical Provider, MD  HYDROcodone-acetaminophen (NORCO/VICODIN) 5-325 MG per tablet Take 1-2 tablets by mouth every 4 (four) hours as needed. 02/14/14   Shari A Upstill, PA-C  hydrOXYzine (ATARAX/VISTARIL) 50 MG tablet Take 50 mg by mouth every 6 (six) hours as needed for anxiety.    Historical Provider, MD  ibuprofen (ADVIL,MOTRIN) 800 MG tablet Take 1 tablet (800 mg total) by mouth 3 (three) times daily. 02/14/14   Shari A Upstill, PA-C  losartan-hydrochlorothiazide (HYZAAR) 100-25 MG per tablet Take 1 tablet by mouth daily.    Historical Provider, MD  ondansetron (ZOFRAN ODT) 4 MG disintegrating tablet Take 1 tablet (4 mg total) by mouth every 8 (eight) hours as needed for nausea. 10/09/13   Garlon HatchetLisa M Sanders, PA-C   BP 163/99 mmHg  Pulse 90  Temp(Src) 98.7 F (37.1 C) (Oral)  SpO2 96% Physical Exam  Constitutional: He is oriented to person, place, and time. He appears well-developed and well-nourished.  HENT:  Head: Normocephalic and atraumatic.  Eyes: Conjunctivae and EOM are normal.  Neck: Normal range of motion.  Cardiovascular: Normal rate.   Pulmonary/Chest: Effort normal.  Abdominal: He exhibits no distension.  Musculoskeletal: Normal range of motion.  Neurological: He is alert and oriented to person, place, and time. Coordination normal.  Skin: Skin is warm and dry.  Psychiatric: He has a normal mood and affect. His behavior is normal.  Anxious  Nursing note and vitals reviewed.   ED Course  Procedures   DIAGNOSTIC STUDIES:  Oxygen Saturation is 96% on RA, normal by my interpretation.    COORDINATION OF CARE:  12:20 PM Discussed treatment plan with pt at bedside and pt agreed to plan.  Labs Review Labs Reviewed - No data to display  Imaging Review No results found.   EKG Interpretation None       MDM   Final diagnoses:  Anxiety    Patient will have 1 dose of Ritalin here and no prescription. Vitals stable and patient afebrile.   I personally performed the services described in this documentation, which was scribed in my presence. The recorded information has been reviewed and is accurate.    Emilia BeckKaitlyn Loriana Samad, PA-C 05/01/14 [id number]  Juliet RudeNathan R. Rubin PayorPickering, MD 05/01/14 [phone]

## 2014-04-30 NOTE — Discharge Instructions (Signed)
Follow up with your doctor for further evaluation and medication management.

## 2014-04-30 NOTE — ED Notes (Signed)
Pt reports having hx of anxiety, usually takes alprazolam 3 times a day, ran out of his prescription 10 days ago and is feeling anxious and jittery, has been unable to get it refilled. Denies any SI or HI.

## 2014-07-07 ENCOUNTER — Encounter (HOSPITAL_COMMUNITY): Payer: Self-pay | Admitting: Emergency Medicine

## 2014-07-07 ENCOUNTER — Emergency Department (HOSPITAL_COMMUNITY)
Admission: EM | Admit: 2014-07-07 | Discharge: 2014-07-07 | Disposition: A | Discharge status: Home / Self Care | Payer: BLUE CROSS/BLUE SHIELD | Attending: Emergency Medicine | Admitting: Emergency Medicine

## 2014-07-07 DIAGNOSIS — Z79899 Other long term (current) drug therapy: Secondary | ICD-10-CM | POA: Insufficient documentation

## 2014-07-07 DIAGNOSIS — I10 Essential (primary) hypertension: Secondary | ICD-10-CM | POA: Insufficient documentation

## 2014-07-07 DIAGNOSIS — W1840XA Slipping, tripping and stumbling without falling, unspecified, initial encounter: Secondary | ICD-10-CM | POA: Diagnosis not present

## 2014-07-07 DIAGNOSIS — Z791 Long term (current) use of non-steroidal anti-inflammatories (NSAID): Secondary | ICD-10-CM | POA: Diagnosis not present

## 2014-07-07 DIAGNOSIS — Y939 Activity, unspecified: Secondary | ICD-10-CM | POA: Diagnosis not present

## 2014-07-07 DIAGNOSIS — S8992XA Unspecified injury of left lower leg, initial encounter: Secondary | ICD-10-CM | POA: Diagnosis present

## 2014-07-07 DIAGNOSIS — S8392XA Sprain of unspecified site of left knee, initial encounter: Secondary | ICD-10-CM | POA: Diagnosis not present

## 2014-07-07 DIAGNOSIS — F419 Anxiety disorder, unspecified: Secondary | ICD-10-CM | POA: Insufficient documentation

## 2014-07-07 DIAGNOSIS — Y929 Unspecified place or not applicable: Secondary | ICD-10-CM | POA: Diagnosis not present

## 2014-07-07 DIAGNOSIS — Y998 Other external cause status: Secondary | ICD-10-CM | POA: Insufficient documentation

## 2014-07-07 HISTORY — DX: Unspecified injury of unspecified lower leg, initial encounter: S89.90XA

## 2014-07-07 MED ORDER — IBUPROFEN 800 MG PO TABS: 800.0000 mg | ORAL_TABLET | Freq: Three times a day (TID) | ORAL | Status: DC

## 2014-07-07 MED ORDER — HYDROCODONE-ACETAMINOPHEN 5-325 MG PO TABS: 1.0000 | ORAL_TABLET | ORAL | Status: DC | PRN

## 2014-07-07 NOTE — ED Notes (Signed)
Bed: WTR6 Expected date:  Expected time:  Means of arrival:  Comments: Tr3

## 2014-07-07 NOTE — ED Provider Notes (Signed)
CSN: [id number]     Arrival date & time 07/07/14  2036 History   First MD Initiated Contact with Patient 07/07/14 2134     Chief Complaint  Patient presents with  . Leg Pain    left     (Consider location/radiation/quality/duration/timing/severity/associated sxs/prior Treatment) Patient is a 34 y.o. male presenting with leg pain. The history is provided by the patient. No language interpreter was used.  Leg Pain Location:  Knee Injury: yes   Knee location:  L knee Associated symptoms: no fever   Associated symptoms comment:  He slipped on wet surface earlier tonight and exacerbating previous left knee injury that involves a PCL rupture that was treated non-surgically with physical therapy. He did not fall directly on the knee but feels he used the knee to keep from falling to the ground.    Past Medical History  Diagnosis Date  . Hypertension   . Anxiety   . PCL injury    Past Surgical History  Procedure Laterality Date  . Shoulder surgery Right 03/2014   Family History  Problem Relation Age of Onset  . Heart disease Father    History  Substance Use Topics  . Smoking status: Never Smoker   . Smokeless tobacco: Not on file  . Alcohol Use: Yes     Comment: occ    Review of Systems  Constitutional: Negative for fever and chills.  Musculoskeletal: Positive for arthralgias.       See HPI  Skin: Negative.  Negative for color change and wound.  Neurological: Negative.  Negative for numbness.      Allergies  Tramadol  Home Medications   Prior to Admission medications   Medication Sig Start Date End Date Taking? Authorizing Provider  losartan-hydrochlorothiazide (HYZAAR) 100-25 MG per tablet Take 1 tablet by mouth daily.   Yes Historical Provider, MD  naproxen sodium (ANAPROX) 220 MG tablet Take 440 mg by mouth 2 (two) times daily as needed (pain).   Yes Historical Provider, MD  QUEtiapine (SEROQUEL XR) 200 MG 24 hr tablet Take 200 mg by mouth at bedtime.   Yes  Historical Provider, MD  ALPRAZolam Prudy Feeler(XANAX) 0.5 MG tablet Take 1 tablet (0.5 mg total) by mouth 3 (three) times daily as needed for anxiety. Patient not taking: Reported on 07/07/2014 10/09/13   Garlon HatchetLisa M Sanders, PA-C  HYDROcodone-acetaminophen (NORCO/VICODIN) 5-325 MG per tablet Take 1-2 tablets by mouth every 4 (four) hours as needed. Patient not taking: Reported on 07/07/2014 02/14/14   Melvenia BeamShari A Lempi Edwin, PA-C  ibuprofen (ADVIL,MOTRIN) 800 MG tablet Take 1 tablet (800 mg total) by mouth 3 (three) times daily. Patient not taking: Reported on 07/07/2014 02/14/14   Melvenia BeamShari A Jim Lundin, PA-C  LORazepam (ATIVAN) 1 MG tablet Take 1 tablet (1 mg total) by mouth 3 (three) times daily as needed for anxiety. Patient not taking: Reported on 07/07/2014 04/30/14   Emilia BeckKaitlyn Szekalski, PA-C  ondansetron (ZOFRAN ODT) 4 MG disintegrating tablet Take 1 tablet (4 mg total) by mouth every 8 (eight) hours as needed for nausea. Patient not taking: Reported on 07/07/2014 10/09/13   Garlon HatchetLisa M Sanders, PA-C   BP 179/127 mmHg  Pulse 104  Temp(Src) 97.9 F (36.6 C) (Oral)  Resp 18  SpO2 98% Physical Exam  Constitutional: He appears well-developed and well-nourished. No distress.  Cardiovascular: Intact distal pulses.   Musculoskeletal:  Left knee without swelling or discoloration. Joint stable without laxity. No calf or thigh tenderness.     ED Course  Procedures (including critical  care time) Labs Review Labs Reviewed - No data to display  Imaging Review No results found.   EKG Interpretation None      MDM   Final diagnoses:  None    1. Left knee sprain  He has a knee brace, will provide crutches and pain management. He is established with Dr. Rennis Chris at [facility] and can follow up Monday in acute care clinic for recheck.     Arnoldo Hooker, PA-C 07/07/14 2245  Candyce Churn III, MD 07/09/14 [phone]

## 2014-07-07 NOTE — Discharge Instructions (Signed)
Cryotherapy °Cryotherapy is when you put ice on your injury. Ice helps lessen pain and puffiness (swelling) after an injury. Ice works the best when you start using it in the first 24 to 48 hours after an injury. °HOME CARE °· Put a dry or damp towel between the ice pack and your skin. °· You may press gently on the ice pack. °· Leave the ice on for no more than 10 to 20 minutes at a time. °· Check your skin after 5 minutes to make sure your skin is okay. °· Rest at least 20 minutes between ice pack uses. °· Stop using ice when your skin loses feeling (numbness). °· Do not use ice on someone who cannot tell you when it hurts. This includes small children and people with memory problems (dementia). °GET HELP RIGHT AWAY IF: °· You have white spots on your skin. °· Your skin turns blue or pale. °· Your skin feels waxy or hard. °· Your puffiness gets worse. °MAKE SURE YOU:  °· Understand these instructions. °· Will watch your condition. °· Will get help right away if you are not doing well or get worse. °Document Released: 10/14/2007 Document Revised: 07/20/2011 Document Reviewed: 12/18/2010 °ExitCare® Patient Information ©2015 [employer]. This information is not intended to replace advice given to you by your health care provider. Make sure you discuss any questions you have with your health care provider. ° °Joint Sprain °A sprain is a tear or stretch in the ligaments that hold a joint together. Severe sprains may need as long as 3-6 weeks of immobilization and/or exercises to heal completely. Sprained joints should be rested and protected. If not, they can become unstable and prone to re-injury. Proper treatment can reduce your pain, shorten the period of disability, and reduce the risk of repeated injuries. °TREATMENT  °· Rest and elevate the injured joint to reduce pain and swelling. °· Apply ice packs to the injury for 20-30 minutes every 2-3 hours for the next 2-3 days. °· Keep the injury wrapped in a  compression bandage or splint as long as the joint is painful or as instructed by your caregiver. °· Do not use the injured joint until it is completely healed to prevent re-injury and chronic instability. Follow the instructions of your caregiver. °· Long-term sprain management may require exercises and/or treatment by a physical therapist. Taping or special braces may help stabilize the joint until it is completely better. °SEEK MEDICAL CARE IF:  °· You develop increased pain or swelling of the joint. °· You develop increasing redness and warmth of the joint. °· You develop a fever. °· It becomes stiff. °· Your hand or foot gets cold or numb. °Document Released: 06/04/2004 Document Revised: 07/20/2011 Document Reviewed: 05/14/2008 °ExitCare® Patient Information ©2015 [employer]. This information is not intended to replace advice given to you by your health care provider. Make sure you discuss any questions you have with your health care provider. ° °

## 2014-07-07 NOTE — ED Notes (Addendum)
Patient c/o left leg pain, states he had a prior PCL @ 2 years ago. Patient states the pain today is similar. Patient is currently wearing a knee brace. Patient states he was standing with knees locked while attempting to bend over, he slipped on milk (while on tile floor), patient states his foot slipped and when it landed on dry tile his stopped but "the top of his leg didn't". Patient is scheduled to see orthopedist at [facility] in two weeks to discuss surgical intervention from prior injury as it was not completely healed. [facility] Patient states the knee began swelling, he applied ice, states he took 2 ibuprofen @ 45 minutes ago.

## 2015-06-12 ENCOUNTER — Ambulatory Visit (INDEPENDENT_AMBULATORY_CARE_PROVIDER_SITE_OTHER): Payer: BLUE CROSS/BLUE SHIELD | Admitting: Physician Assistant

## 2015-06-12 VITALS — BP 144/82 | HR 94 | Temp 98.0°F | Resp 17 | Ht 67.5 in | Wt 208.0 lb

## 2015-06-12 DIAGNOSIS — M79672 Pain in left foot: Secondary | ICD-10-CM | POA: Diagnosis not present

## 2015-06-12 MED ORDER — IBUPROFEN 600 MG PO TABS: 600.0000 mg | ORAL_TABLET | Freq: Three times a day (TID) | ORAL | Status: DC | PRN

## 2015-06-12 NOTE — Progress Notes (Signed)
06/12/2015 3:23 PM   DOB: [birth year] / MRN: [id number]  SUBJECTIVE:  Edwin Roberson is a 35 y.o. male presenting for left sided heel pain that started 4 days ago after missing a step while unloading some furniture from. States he was walking backwards.  Reports no pain immediately following the misstep, but has developed some pain the day after which is mildly worsening.  He has tried Ibuprofen 600 mg once or twice with some relief.  Denies a history of trauma to the the right foot and ankle, and denies ankle pain today.    He is allergic to tramadol.   He  has a past medical history of Hypertension; Anxiety; and PCL injury.    He  reports that he has never smoked. He does not have any smokeless tobacco history on file. He reports that he drinks alcohol. He reports that he does not use illicit drugs. He  has no sexual activity history on file. The patient  has past surgical history that includes Shoulder surgery (Right, 03/2014).  His family history includes Heart disease in his father.  Review of Systems  Constitutional: Negative for fever and chills.  Eyes: Negative for blurred vision.  Respiratory: Negative for cough and shortness of breath.   Cardiovascular: Negative for chest pain.  Gastrointestinal: Negative for nausea and abdominal pain.  Genitourinary: Negative for dysuria, urgency and frequency.  Musculoskeletal: Negative for myalgias.  Skin: Negative for rash.  Neurological: Negative for dizziness, tingling and headaches.  Psychiatric/Behavioral: Negative for depression. The patient is not nervous/anxious.     Problem list and medications reviewed and updated by myself where necessary, and exist elsewhere in the encounter.   OBJECTIVE:  BP 144/82 mmHg  Pulse 94  Temp(Src) 98 F (36.7 C) (Oral)  Resp 17  Ht 5' 7.5" (1.715 m)  Wt 208 lb (94.348 kg)  BMI 32.08 kg/m2  SpO2 97%  Physical Exam  Constitutional: He is oriented to person, place, and time. He appears  well-developed. He does not appear ill.  Eyes: Conjunctivae and EOM are normal. Pupils are equal, round, and reactive to light.  Cardiovascular: Normal rate and regular rhythm.   Pulmonary/Chest: Effort normal and breath sounds normal.  Abdominal: He exhibits no distension.  Musculoskeletal: Normal range of motion. He exhibits no edema.       Right foot: Normal.       Left foot: There is tenderness.       Feet:  Neurological: He is alert and oriented to person, place, and time. No cranial nerve deficit. Coordination normal.  Skin: Skin is warm and dry. He is not diaphoretic.  Psychiatric: He has a normal mood and affect.  Nursing note and vitals reviewed.   No results found for this or any previous visit (from the past 72 hour(s)).  No results found.  ASSESSMENT AND PLAN  Stancil was seen today for heel pain and knee pain.  Diagnoses and all orders for this visit:  Heel pain, left:  Exam is unremarkable.  Fracture very unlikely. This is likely a bruise.  Advised we give this more time to resolve.  Will step up pain management to tramadol per patient's request, but have advised against opioids despite patient's request.     -     ibuprofen (ADVIL,MOTRIN) 600 MG tablet; Take 1 tablet (600 mg total) by mouth every 8 (eight) hours as needed.    The patient was advised to call or return to clinic if he does not see an  improvement in symptoms or to seek the care of the closest emergency department if he worsens with the above plan.   Deliah Boston, MHS, PA-C [facility] 06/12/2015 3:23 PM

## 2015-11-06 ENCOUNTER — Institutional Professional Consult (permissible substitution): Payer: BLUE CROSS/BLUE SHIELD | Admitting: Neurology

## 2015-11-13 ENCOUNTER — Telehealth: Payer: Self-pay

## 2015-11-13 NOTE — Telephone Encounter (Signed)
Appt was marked as a no show, but patient had called to cancel a couple of days prior to appt. [facility], who was out of the office at that time.

## 2015-11-15 ENCOUNTER — Encounter: Payer: Self-pay | Admitting: Neurology

## 2015-12-29 ENCOUNTER — Ambulatory Visit (HOSPITAL_COMMUNITY)
Admission: EM | Admit: 2015-12-29 | Discharge: 2015-12-29 | Disposition: A | Discharge status: Home / Self Care | Payer: BLUE CROSS/BLUE SHIELD | Attending: Internal Medicine | Admitting: Internal Medicine

## 2015-12-29 ENCOUNTER — Encounter (HOSPITAL_COMMUNITY): Payer: Self-pay | Admitting: Nurse Practitioner

## 2015-12-29 DIAGNOSIS — S61219A Laceration without foreign body of unspecified finger without damage to nail, initial encounter: Secondary | ICD-10-CM

## 2015-12-29 MED ORDER — POVIDONE-IODINE 10 % EX SOLN
CUTANEOUS | Status: AC
  Filled 2015-12-29: qty 118

## 2015-12-29 MED ORDER — BACITRACIN ZINC 500 UNIT/GM EX OINT
TOPICAL_OINTMENT | CUTANEOUS | Status: AC
  Filled 2015-12-29: qty 0.9

## 2015-12-29 NOTE — ED Provider Notes (Signed)
CSN: [id number]     Arrival date & time 12/29/15  1313 History   First MD Initiated Contact with Patient 12/29/15 1427     Chief Complaint  Patient presents with  . Finger Injury  . Shoulder Pain   (Consider location/radiation/quality/duration/timing/severity/associated sxs/prior Treatment) HPI 35 year old male cut his finger while using a utility knife. Bleeding was controlled with pressure tetanus is up-to-date minimal pain at this time. Accident happened today. Past Medical History:  Diagnosis Date  . Anxiety   . Hypertension   . PCL injury    Past Surgical History:  Procedure Laterality Date  . SHOULDER SURGERY Right 03/2014   Family History  Problem Relation Age of Onset  . Heart disease Father    Social History  Substance Use Topics  . Smoking status: Never Smoker  . Smokeless tobacco: Never Used  . Alcohol use No     Comment: occ    Review of Systems  Denies: HEADACHE, NAUSEA, ABDOMINAL PAIN, CHEST PAIN, CONGESTION, DYSURIA, SHORTNESS OF BREATH  Allergies  Tramadol  Home Medications   Prior to Admission medications   Medication Sig Start Date End Date Taking? Authorizing Provider  amphetamine-dextroamphetamine (ADDERALL XR) 30 MG 24 hr capsule Take 30 mg by mouth daily.   Yes Historical Provider, MD  carvedilol (COREG) 6.25 MG tablet Take 6.25 mg by mouth 2 (two) times daily with a meal.   Yes Historical Provider, MD  ibuprofen (ADVIL,MOTRIN) 600 MG tablet Take 1 tablet (600 mg total) by mouth every 8 (eight) hours as needed. 06/12/15   Ofilia NeasMichael L Clark, PA-C  losartan-hydrochlorothiazide (HYZAAR) 100-25 MG per tablet Take 1 tablet by mouth daily. Reported on 06/12/2015    Historical Provider, MD  metoprolol succinate (TOPROL-XL) 100 MG 24 hr tablet Take 100 mg by mouth daily. Take with or immediately following a meal.    Historical Provider, MD  naproxen sodium (ANAPROX) 220 MG tablet Take 440 mg by mouth 2 (two) times daily as needed (pain).    Historical Provider,  MD  QUEtiapine (SEROQUEL XR) 200 MG 24 hr tablet Take 200 mg by mouth at bedtime.    Historical Provider, MD   Meds Ordered and Administered this Visit  Medications - No data to display  BP 136/83   Pulse 83   Temp 97.7 F (36.5 C) (Oral)   Resp 17   SpO2 100%  No data found.   Physical Exam NURSES NOTES AND VITAL SIGNS REVIEWED. CONSTITUTIONAL: Well developed, well nourished, no acute distress HEENT: normocephalic, atraumatic EYES: Conjunctiva normal NECK:normal ROM, supple, no adenopathy PULMONARY:No respiratory distress, normal effort ABDOMINAL: Soft, ND, NT BS+, No CVAT MUSCULOSKELETAL: Normal ROM of all extremities, and a half centimeter laceration to the left thumb just at the DIP.Marland Kitchen. No active bleeding. Some tenderness to palpation. Sensory and motor function is intact distally. SKIN: warm and dry without rash PSYCHIATRIC: Mood and affect, behavior are normal  Urgent Care Course   Clinical Course    .Marland Kitchen.Laceration Repair Date/Time: 12/29/2015 9:09 PM Performed by: Tharon AquasPATRICK, Anastazia Creek C Authorized by: Eustace MooreMURRAY, LAURA W   Consent:    Consent obtained:  Verbal   Consent given by:  Patient   Risks discussed:  Infection Anesthesia (see MAR for exact dosages):    Anesthesia method:  Local infiltration   Local anesthetic:  Lidocaine 1% w/o epi Laceration details:    Location:  Finger   Finger location:  R thumb Repair type:    Repair type:  Simple Pre-procedure details:  Preparation:  Patient was prepped and draped in usual sterile fashion Exploration:    Hemostasis achieved with:  Direct pressure Treatment:    Area cleansed with:  Shur-Clens Skin repair:    Repair method:  Sutures   Suture size:  5-0   Suture material:  Prolene   Number of sutures:  4 Approximation:    Approximation:  Close   Vermilion border: well-aligned   Post-procedure details:    Dressing:  Antibiotic ointment and non-adherent dressing   Patient tolerance of procedure:  Tolerated well, no  immediate complications   (including critical care time)  Labs Review Labs Reviewed - No data to display  Imaging Review No results found.   Visual Acuity Review  Right Eye Distance:   Left Eye Distance:   Bilateral Distance:    Right Eye Near:   Left Eye Near:    Bilateral Near:         MDM   1. Finger laceration, initial encounter     Patient is reassured that there are no issues that require transfer to higher level of care at this time or additional tests. Patient is advised to continue home symptomatic treatment. Patient is advised that if there are new or worsening symptoms to attend the emergency department, contact primary care provider, or return to UC. Instructions of care provided discharged home in stable condition.    THIS NOTE WAS GENERATED USING A VOICE RECOGNITION SOFTWARE PROGRAM. ALL REASONABLE EFFORTS  WERE MADE TO PROOFREAD THIS DOCUMENT FOR ACCURACY.  I have verbally reviewed the discharge instructions with the patient. A printed AVS was given to the patient.  All questions were answered prior to discharge.      Tharon AquasFrank C Arvel Oquinn, PA 12/29/15 2112

## 2015-12-29 NOTE — ED Triage Notes (Signed)
Pt presents with laceration to R thumb with box cutter while cutting zip ties today. Cms intact. He c/o pain at site. Also c/o 3 week history of L shoulder pain . Pain got worse about 2 weeks ago while working on a trailer with a Marine scientistwrench. He has tried ibuprofen with minimal relief. Cms intact.

## 2016-04-07 ENCOUNTER — Inpatient Hospital Stay (HOSPITAL_COMMUNITY)
Admission: EM | Admit: 2016-04-07 | Discharge: 2016-04-08 | DRG: 066 | Disposition: A | Discharge status: Other Acute Inpatient Hospital | Payer: Managed Care, Other (non HMO) | Attending: Internal Medicine | Admitting: Internal Medicine

## 2016-04-07 ENCOUNTER — Encounter (HOSPITAL_COMMUNITY): Payer: Self-pay | Admitting: *Deleted

## 2016-04-07 ENCOUNTER — Encounter (HOSPITAL_COMMUNITY): Payer: Self-pay | Admitting: Emergency Medicine

## 2016-04-07 ENCOUNTER — Ambulatory Visit (HOSPITAL_COMMUNITY)
Admission: EM | Admit: 2016-04-07 | Discharge: 2016-04-07 | Disposition: A | Discharge status: Nursing Home (Includes ICF) | Payer: Managed Care, Other (non HMO) | Attending: Emergency Medicine | Admitting: Emergency Medicine

## 2016-04-07 ENCOUNTER — Emergency Department (HOSPITAL_COMMUNITY): Payer: Managed Care, Other (non HMO)

## 2016-04-07 DIAGNOSIS — I779 Disorder of arteries and arterioles, unspecified: Secondary | ICD-10-CM

## 2016-04-07 DIAGNOSIS — R519 Headache, unspecified: Secondary | ICD-10-CM

## 2016-04-07 DIAGNOSIS — I10 Essential (primary) hypertension: Secondary | ICD-10-CM | POA: Diagnosis present

## 2016-04-07 DIAGNOSIS — Z8249 Family history of ischemic heart disease and other diseases of the circulatory system: Secondary | ICD-10-CM

## 2016-04-07 DIAGNOSIS — G459 Transient cerebral ischemic attack, unspecified: Secondary | ICD-10-CM

## 2016-04-07 DIAGNOSIS — R42 Dizziness and giddiness: Secondary | ICD-10-CM | POA: Diagnosis not present

## 2016-04-07 DIAGNOSIS — Z87891 Personal history of nicotine dependence: Secondary | ICD-10-CM

## 2016-04-07 DIAGNOSIS — H53462 Homonymous bilateral field defects, left side: Secondary | ICD-10-CM | POA: Diagnosis not present

## 2016-04-07 DIAGNOSIS — G43909 Migraine, unspecified, not intractable, without status migrainosus: Secondary | ICD-10-CM

## 2016-04-07 DIAGNOSIS — I639 Cerebral infarction, unspecified: Principal | ICD-10-CM | POA: Diagnosis present

## 2016-04-07 DIAGNOSIS — R112 Nausea with vomiting, unspecified: Secondary | ICD-10-CM | POA: Diagnosis present

## 2016-04-07 DIAGNOSIS — R51 Headache: Secondary | ICD-10-CM | POA: Diagnosis not present

## 2016-04-07 DIAGNOSIS — R202 Paresthesia of skin: Secondary | ICD-10-CM

## 2016-04-07 DIAGNOSIS — G319 Degenerative disease of nervous system, unspecified: Secondary | ICD-10-CM | POA: Diagnosis present

## 2016-04-07 DIAGNOSIS — Z79899 Other long term (current) drug therapy: Secondary | ICD-10-CM

## 2016-04-07 DIAGNOSIS — I739 Peripheral vascular disease, unspecified: Secondary | ICD-10-CM

## 2016-04-07 DIAGNOSIS — R55 Syncope and collapse: Secondary | ICD-10-CM

## 2016-04-07 DIAGNOSIS — F419 Anxiety disorder, unspecified: Secondary | ICD-10-CM

## 2016-04-07 DIAGNOSIS — R200 Anesthesia of skin: Secondary | ICD-10-CM

## 2016-04-07 DIAGNOSIS — M542 Cervicalgia: Secondary | ICD-10-CM | POA: Diagnosis present

## 2016-04-07 HISTORY — DX: Migraine, unspecified, not intractable, without status migrainosus: G43.909

## 2016-04-07 LAB — CBC WITH DIFFERENTIAL/PLATELET
Basophils Absolute: 0.1 10*3/uL (ref 0.0–0.1)
Basophils Relative: 1 %
Eosinophils Absolute: 0.6 10*3/uL (ref 0.0–0.7)
Eosinophils Relative: 5 %
HEMATOCRIT: 42.5 % (ref 39.0–52.0)
Hemoglobin: 15 g/dL (ref 13.0–17.0)
LYMPHS PCT: 35 %
Lymphs Abs: 4.2 10*3/uL — ABNORMAL HIGH (ref 0.7–4.0)
MCH: 30 pg (ref 26.0–34.0)
MCHC: 35.3 g/dL (ref 30.0–36.0)
MCV: 85 fL (ref 78.0–100.0)
Monocytes Absolute: 1 10*3/uL (ref 0.1–1.0)
Monocytes Relative: 8 %
NEUTROS ABS: 6.3 10*3/uL (ref 1.7–7.7)
Neutrophils Relative %: 51 %
Platelets: 272 10*3/uL (ref 150–400)
RBC: 5 MIL/uL (ref 4.22–5.81)
RDW: 13.1 % (ref 11.5–15.5)
WBC: 12.2 10*3/uL — AB (ref 4.0–10.5)

## 2016-04-07 LAB — COMPREHENSIVE METABOLIC PANEL
ALK PHOS: 40 U/L (ref 38–126)
ALT: 14 U/L — AB (ref 17–63)
AST: 15 U/L (ref 15–41)
Albumin: 4 g/dL (ref 3.5–5.0)
Anion gap: 10 (ref 5–15)
BILIRUBIN TOTAL: 0.8 mg/dL (ref 0.3–1.2)
BUN: 11 mg/dL (ref 6–20)
CO2: 26 mmol/L (ref 22–32)
CREATININE: 1.15 mg/dL (ref 0.61–1.24)
Calcium: 9.1 mg/dL (ref 8.9–10.3)
Chloride: 103 mmol/L (ref 101–111)
GFR calc Af Amer: >60 mL/min (ref >60)
Glucose, Bld: 91 mg/dL (ref 65–99)
Potassium: 3.9 mmol/L (ref 3.5–5.1)
Sodium: 139 mmol/L (ref 135–145)
Total Protein: 6.7 g/dL (ref 6.5–8.1)

## 2016-04-07 LAB — URINALYSIS, ROUTINE W REFLEX MICROSCOPIC
BILIRUBIN URINE: NEGATIVE
Glucose, UA: NEGATIVE mg/dL
HGB URINE DIPSTICK: NEGATIVE
Ketones, ur: NEGATIVE mg/dL
Leukocytes, UA: NEGATIVE
Nitrite: NEGATIVE
Protein, ur: NEGATIVE mg/dL
SPECIFIC GRAVITY, URINE: 1.028 (ref 1.005–1.030)
pH: 6.5 (ref 5.0–8.0)

## 2016-04-07 MED ORDER — IOPAMIDOL (ISOVUE-370) INJECTION 76%
INTRAVENOUS | Status: AC
  Filled 2016-04-07: qty 50

## 2016-04-07 MED ORDER — IOPAMIDOL (ISOVUE-370) INJECTION 76%
INTRAVENOUS | Status: AC
  Administered 2016-04-07: 50 mL
  Filled 2016-04-07: qty 50

## 2016-04-07 NOTE — Discharge Instructions (Signed)
Please go straight to the emergency room. With these neurologic signs, I'm concerned that this is not just a migraine, but something going on in your brain.

## 2016-04-07 NOTE — ED Provider Notes (Signed)
[facility]    CSN: [id number] Arrival date & time: 04/07/16  1747     History   Chief Complaint Chief Complaint  Patient presents with  . Migraine    HPI Edwin Roberson is a 35 y.o. male.   HPI  He is a 35 year old man here for evaluation of migraine. He states his symptoms started on Monday with a painful throat. He states it felt like his throat was swollen. When he was feeling along the right side of his neck he suddenly felt like he was about to pass out and his left arm felt numb and heavy. At the same time, he developed a headache on the right side of his head. It is focal and located in the temple. The headache is worse with coughing, bending forward, or straining. He also reports an episode where he was unable to express what he wanted to say. There was another episode where he couldn't see things in the left half of his vision. He continues to have some numbness in his left hand. He does have some sensitivity to light and sound. He states he has had difficulty with writing, in that it doesn't end up where he wants to on the page. He has also had difficulty typing.  He does have a history of migraine headaches, but he has never had any certain neurologic symptom with him before. He also states they are not typically so localized. They also tend to resolve with sleep, and this headache has not.  Past Medical History:  Diagnosis Date  . Anxiety   . Hypertension   . Migraine   . PCL injury     Patient Active Problem List   Diagnosis Date Noted  . HTN (hypertension) 07/11/2012    Past Surgical History:  Procedure Laterality Date  . SHOULDER SURGERY Right 03/2014       Home Medications    Prior to Admission medications   Medication Sig Start Date End Date Taking? Authorizing Provider  amphetamine-dextroamphetamine (ADDERALL XR) 30 MG 24 hr capsule Take 30 mg by mouth daily.    Historical Provider, MD  carvedilol (COREG) 6.25 MG tablet Take 6.25 mg  by mouth 2 (two) times daily with a meal.    Historical Provider, MD  ibuprofen (ADVIL,MOTRIN) 600 MG tablet Take 1 tablet (600 mg total) by mouth every 8 (eight) hours as needed. 06/12/15   Ofilia NeasMichael L Clark, PA-C  losartan-hydrochlorothiazide (HYZAAR) 100-25 MG per tablet Take 1 tablet by mouth daily. Reported on 06/12/2015    Historical Provider, MD  naproxen sodium (ANAPROX) 220 MG tablet Take 440 mg by mouth 2 (two) times daily as needed (pain).    Historical Provider, MD    Family History Family History  Problem Relation Age of Onset  . Heart disease Father     Social History Social History  Substance Use Topics  . Smoking status: Never Smoker  . Smokeless tobacco: Never Used  . Alcohol use No     Comment: occ     Allergies   Tramadol   Review of Systems Review of Systems As in history of present illness  Physical Exam Triage Vital Signs ED Triage Vitals [04/07/16 1858]  Enc Vitals Group     BP 127/76     Pulse Rate 72     Resp 18     Temp 98.6 F (37 C)     Temp src      SpO2 98 %     Weight  Height      Head Circumference      Peak Flow      Pain Score 6     Pain Loc      Pain Edu?      Excl. in GC?    No data found.   Updated Vital Signs BP 127/76 (BP Location: Left Arm)   Pulse 72   Temp 98.6 F (37 C)   Resp 18   SpO2 98%   Visual Acuity Right Eye Distance:   Left Eye Distance:   Bilateral Distance:    Right Eye Near:   Left Eye Near:    Bilateral Near:     Physical Exam  Constitutional: He is oriented to person, place, and time. He appears well-developed and well-nourished. No distress.  He appears somewhat slowed  Cardiovascular: Normal rate.   Pulmonary/Chest: Effort normal.  Neurological: He is alert and oriented to person, place, and time. No cranial nerve deficit. He exhibits normal muscle tone. Coordination normal.  He reports some difficulty with finger to nose testing on the left. He is able to touch my finger, but states  he is not touching where he was expecting to.  Visual fields intact. When asked to draw a clock, he had difficulty with that task. He did utilize the entire circle, but filled in second numbers as opposed to hour numbers.     UC Treatments / Results  Labs (all labs ordered are listed, but only abnormal results are displayed) Labs Reviewed - No data to display  EKG  EKG Interpretation None       Radiology No results found.  Procedures Procedures (including critical care time)  Medications Ordered in UC Medications - No data to display   Initial Impression / Assessment and Plan / UC Course  I have reviewed the triage vital signs and the nursing notes.  Pertinent labs & imaging results that were available during my care of the patient were reviewed by me and considered in my medical decision making (see chart for details).  Clinical Course     We'll transfer to the emergency room for additional evaluation given these intermittent neurologic symptoms.  Final Clinical Impressions(s) / UC Diagnoses   Final diagnoses:  Acute nonintractable headache, unspecified headache type  Homonymous hemianopsia, left    New Prescriptions Discharge Medication List as of 04/07/2016  7:32 PM       Charm RingsErin J Aryka Coonradt, MD 04/07/16 [phone]

## 2016-04-07 NOTE — ED Triage Notes (Addendum)
Pt. reports intermittent headache and intermittent left hand numbness ( difficulty typing ) onset yesterday , alert and oriented , speech clear with no facial asymmetry , equal gips with no arm drift/ambulatory . No numbness at triage . Dr. Juleen ChinaKohut evaluated pt. at triage.

## 2016-04-07 NOTE — ED Notes (Signed)
CT informed of IV placement

## 2016-04-07 NOTE — ED Provider Notes (Signed)
MSE was initiated and I personally evaluated the patient and placed orders (if any) at  8:16 PM on April 07, 2016.  The patient appears stable so that the remainder of the MSE may be completed by another provider.  35yM with R sided headache, intermittent dizziness and clumsiness of L UE. My neuro exam was fairly unremarkable. Well outside of window for TPA. Some concern for possible vertebral artery dissection. Will put in order for CT, but I think he is appropriate to wait for next available provider.     Raeford RazorStephen Ahmia Colford, MD 04/07/16 2018

## 2016-04-07 NOTE — ED Triage Notes (Signed)
History  Of  Migraines        C/o  Headache     Numbness  l   Hand       Photophobia         Pain  r  Side     Of  Headache  Nausea           Lightheaded      Symptoms  X  sev  Days     Had  sorethropat   On  Sunday

## 2016-04-08 ENCOUNTER — Inpatient Hospital Stay (HOSPITAL_COMMUNITY): Payer: Managed Care, Other (non HMO)

## 2016-04-08 ENCOUNTER — Emergency Department (HOSPITAL_COMMUNITY): Payer: Managed Care, Other (non HMO)

## 2016-04-08 ENCOUNTER — Observation Stay (HOSPITAL_COMMUNITY): Payer: Managed Care, Other (non HMO)

## 2016-04-08 DIAGNOSIS — I639 Cerebral infarction, unspecified: Secondary | ICD-10-CM

## 2016-04-08 DIAGNOSIS — G43909 Migraine, unspecified, not intractable, without status migrainosus: Secondary | ICD-10-CM

## 2016-04-08 DIAGNOSIS — R55 Syncope and collapse: Secondary | ICD-10-CM | POA: Diagnosis not present

## 2016-04-08 DIAGNOSIS — R200 Anesthesia of skin: Secondary | ICD-10-CM | POA: Diagnosis not present

## 2016-04-08 DIAGNOSIS — F419 Anxiety disorder, unspecified: Secondary | ICD-10-CM

## 2016-04-08 DIAGNOSIS — G459 Transient cerebral ischemic attack, unspecified: Secondary | ICD-10-CM | POA: Diagnosis not present

## 2016-04-08 DIAGNOSIS — R112 Nausea with vomiting, unspecified: Secondary | ICD-10-CM | POA: Diagnosis present

## 2016-04-08 DIAGNOSIS — G43809 Other migraine, not intractable, without status migrainosus: Secondary | ICD-10-CM

## 2016-04-08 DIAGNOSIS — R42 Dizziness and giddiness: Secondary | ICD-10-CM | POA: Diagnosis present

## 2016-04-08 DIAGNOSIS — Z8249 Family history of ischemic heart disease and other diseases of the circulatory system: Secondary | ICD-10-CM | POA: Diagnosis not present

## 2016-04-08 DIAGNOSIS — R202 Paresthesia of skin: Secondary | ICD-10-CM

## 2016-04-08 DIAGNOSIS — I10 Essential (primary) hypertension: Secondary | ICD-10-CM | POA: Diagnosis present

## 2016-04-08 DIAGNOSIS — M542 Cervicalgia: Secondary | ICD-10-CM | POA: Diagnosis present

## 2016-04-08 DIAGNOSIS — Z79899 Other long term (current) drug therapy: Secondary | ICD-10-CM | POA: Diagnosis not present

## 2016-04-08 DIAGNOSIS — I779 Disorder of arteries and arterioles, unspecified: Secondary | ICD-10-CM

## 2016-04-08 DIAGNOSIS — I739 Peripheral vascular disease, unspecified: Secondary | ICD-10-CM

## 2016-04-08 DIAGNOSIS — G319 Degenerative disease of nervous system, unspecified: Secondary | ICD-10-CM | POA: Diagnosis present

## 2016-04-08 DIAGNOSIS — Z87891 Personal history of nicotine dependence: Secondary | ICD-10-CM | POA: Diagnosis not present

## 2016-04-08 LAB — VAS US CAROTID
LCCADDIAS: -28 cm/s
LCCADSYS: -126 cm/s
LCCAPDIAS: 28 cm/s
LEFT ECA DIAS: -14 cm/s
LEFT VERTEBRAL DIAS: 19 cm/s
LICADSYS: -115 cm/s
LICAPDIAS: -82 cm/s
Left CCA prox sys: 134 cm/s
Left ICA dist dias: -49 cm/s
Left ICA prox sys: -200 cm/s
RCCAPDIAS: 15 cm/s
RCCAPSYS: 93 cm/s
RIGHT ECA DIAS: -19 cm/s
RIGHT VERTEBRAL DIAS: 14 cm/s
Right cca dist sys: -63 cm/s

## 2016-04-08 LAB — LIPID PANEL
Cholesterol: 157 mg/dL (ref 0–200)
HDL: 42 mg/dL (ref >40)
LDL CALC: 91 mg/dL (ref 0–99)
Total CHOL/HDL Ratio: 3.7 RATIO
Triglycerides: 119 mg/dL (ref <150)
VLDL: 24 mg/dL (ref 0–40)

## 2016-04-08 LAB — GLUCOSE, CAPILLARY: Glucose-Capillary: 171 mg/dL — ABNORMAL HIGH (ref 65–99)

## 2016-04-08 LAB — ANTITHROMBIN III: AntiThromb III Func: 97 % (ref 75–120)

## 2016-04-08 MED ORDER — CARVEDILOL 6.25 MG PO TABS
6.2500 mg | ORAL_TABLET | Freq: Two times a day (BID) | ORAL | Status: DC
  Administered 2016-04-08 (×2): 6.25 mg via ORAL
  Filled 2016-04-08 (×2): qty 1

## 2016-04-08 MED ORDER — DIVALPROEX SODIUM ER 500 MG PO TB24: 500.0000 mg | ORAL_TABLET | Freq: Every day | ORAL | Status: DC

## 2016-04-08 MED ORDER — HYDROCHLOROTHIAZIDE 25 MG PO TABS
25.0000 mg | ORAL_TABLET | Freq: Every day | ORAL | Status: DC
  Administered 2016-04-08: 25 mg via ORAL
  Filled 2016-04-08: qty 1

## 2016-04-08 MED ORDER — POLYETHYLENE GLYCOL 3350 17 G PO PACK
17.0000 g | PACK | Freq: Every day | ORAL | Status: DC
  Administered 2016-04-08: 17 g via ORAL
  Filled 2016-04-08: qty 1

## 2016-04-08 MED ORDER — ATORVASTATIN CALCIUM 40 MG PO TABS
40.0000 mg | ORAL_TABLET | Freq: Every day | ORAL | Status: DC
  Administered 2016-04-08: 40 mg via ORAL
  Filled 2016-04-08: qty 1

## 2016-04-08 MED ORDER — ACETAMINOPHEN 325 MG PO TABS
650.0000 mg | ORAL_TABLET | ORAL | Status: DC | PRN
  Administered 2016-04-08 (×2): 650 mg via ORAL
  Filled 2016-04-08 (×2): qty 2

## 2016-04-08 MED ORDER — DIVALPROEX SODIUM ER 500 MG PO TB24
1000.0000 mg | ORAL_TABLET | Freq: Once | ORAL | Status: DC
  Filled 2016-04-08: qty 2

## 2016-04-08 MED ORDER — ASPIRIN 325 MG PO TABS: 325.0000 mg | ORAL_TABLET | Freq: Every day | ORAL | Status: AC

## 2016-04-08 MED ORDER — ASPIRIN 325 MG PO TABS
325.0000 mg | ORAL_TABLET | Freq: Every day | ORAL | Status: DC
  Administered 2016-04-08: 325 mg via ORAL
  Filled 2016-04-08: qty 1

## 2016-04-08 MED ORDER — LOSARTAN POTASSIUM-HCTZ 100-25 MG PO TABS: 1.0000 | ORAL_TABLET | Freq: Every day | ORAL | Status: DC

## 2016-04-08 MED ORDER — ELETRIPTAN HYDROBROMIDE 40 MG PO TABS
40.0000 mg | ORAL_TABLET | Freq: Two times a day (BID) | ORAL | Status: DC | PRN
  Administered 2016-04-08: 40 mg via ORAL
  Filled 2016-04-08 (×2): qty 1

## 2016-04-08 MED ORDER — ATORVASTATIN CALCIUM 40 MG PO TABS: 40.0000 mg | ORAL_TABLET | Freq: Every day | ORAL | Status: DC

## 2016-04-08 MED ORDER — HYDROCODONE-ACETAMINOPHEN 5-325 MG PO TABS: 1.0000 | ORAL_TABLET | Freq: Once | ORAL | Status: DC | PRN

## 2016-04-08 MED ORDER — HYDROCODONE-ACETAMINOPHEN 5-325 MG PO TABS
1.0000 | ORAL_TABLET | Freq: Once | ORAL | Status: AC
  Administered 2016-04-08: 1 via ORAL
  Filled 2016-04-08: qty 1

## 2016-04-08 MED ORDER — LOSARTAN POTASSIUM 50 MG PO TABS
100.0000 mg | ORAL_TABLET | Freq: Every day | ORAL | Status: DC
  Administered 2016-04-08: 100 mg via ORAL
  Filled 2016-04-08: qty 2

## 2016-04-08 MED ORDER — ONDANSETRON HCL 4 MG/2ML IJ SOLN
4.0000 mg | Freq: Four times a day (QID) | INTRAMUSCULAR | Status: DC | PRN
  Administered 2016-04-08 (×2): 4 mg via INTRAVENOUS
  Filled 2016-04-08 (×2): qty 2

## 2016-04-08 MED ORDER — ACETAMINOPHEN 650 MG RE SUPP: 650.0000 mg | RECTAL | Status: DC | PRN

## 2016-04-08 MED ORDER — VALPROATE SODIUM 500 MG/5ML IV SOLN
1000.0000 mg | Freq: Once | INTRAVENOUS | Status: AC
  Administered 2016-04-08: 1000 mg via INTRAVENOUS
  Filled 2016-04-08: qty 10

## 2016-04-08 NOTE — ED Notes (Signed)
Pt transported to CT ?

## 2016-04-08 NOTE — Consult Note (Addendum)
Admission H&P    Chief Complaint: Neck pain and headache as well as recurrent weakness of left upper extremity.  HPI: Edwin Roberson is an 35 y.o. male history of hypertension, migraine headaches and anxiety presenting with recurrent episodes of weakness involving his left upper extremity, particularly his left hand, as well as right-sided headache and pain involving right side of his neck. He has not experienced neck trauma. Onset of symptoms was on 04/05/2016. Patient likely pressed on the right side of his neck and became very lightheaded and felt that he might lose consciousness, and in addition he had marked weakness and heaviness of his left upper extremity which lasted about 1 minute. CT scan of his head showed no acute changes. Mild atrophic changes more advanced than expected for age were noted. CT angiogram showed 80-90% stenosis of proximal internal carotid artery and 70% stenosis of left internal carotid. CT angiogram of the head showed no large vessel occlusion or significant stenosis intracranially. Patient has not been on antiplatelet therapy routinely. NIH stroke score at the time of this evaluation was 0.  LSN: 6:30 PM on 04/07/2016 tPA Given: No: Deficits rapidly resolved mRankin:  Past Medical History:  Diagnosis Date  . Anxiety   . Hypertension   . Migraine   . PCL injury     Past Surgical History:  Procedure Laterality Date  . SHOULDER SURGERY Right 03/2014    Family History  Problem Relation Age of Onset  . Heart disease Father    Social History:  reports that he has never smoked. He has never used smokeless tobacco. He reports that he does not drink alcohol or use drugs.  Allergies:  Allergies  Allergen Reactions  . Tramadol Other (See Comments)    Pt felt out of it    Medications: Preadmission medications were reviewed by me.  ROS: History obtained from the patient  General ROS: negative for - chills, fatigue, fever, night sweats, weight gain or  weight loss Psychological ROS: negative for - behavioral disorder, hallucinations, memory difficulties, mood swings or suicidal ideation Ophthalmic ROS: negative for - blurry vision, double vision, eye pain or loss of vision ENT ROS: negative for - epistaxis, nasal discharge, oral lesions, sore throat, tinnitus or vertigo Allergy and Immunology ROS: negative for - hives or itchy/watery eyes Hematological and Lymphatic ROS: negative for - bleeding problems, bruising or swollen lymph nodes Endocrine ROS: negative for - galactorrhea, hair pattern changes, polydipsia/polyuria or temperature intolerance Respiratory ROS: negative for - cough, hemoptysis, shortness of breath or wheezing Cardiovascular ROS: negative for - chest pain, dyspnea on exertion, edema or irregular heartbeat Gastrointestinal ROS: negative for - abdominal pain, diarrhea, hematemesis, nausea/vomiting or stool incontinence Genito-Urinary ROS: negative for - dysuria, hematuria, incontinence or urinary frequency/urgency Musculoskeletal ROS: negative for - joint swelling or muscular weakness Neurological ROS: as noted in HPI Dermatological ROS: negative for rash and skin lesion changes  Physical Examination: Blood pressure 125/80, pulse 73, temperature 97.7 F (36.5 C), temperature source Oral, resp. rate 12, height '5\' 6"'  (1.676 m), weight 81.6 kg (180 lb), SpO2 97 %.  HEENT-  Normocephalic, no lesions, without obvious abnormality.  Normal external eye and conjunctiva.  Normal TM's bilaterally.  Normal auditory canals and external ears. Normal external nose, mucus membranes and septum.  Normal pharynx. Neck supple with no masses, nodes, nodules or enlargement. Cardiovascular - regular rate and rhythm, S1, S2 normal, no murmur, click, rub or gallop Lungs - chest clear, no wheezing, rales, normal symmetric air entry  Abdomen - soft, non-tender; bowel sounds normal; no masses,  no organomegaly Extremities - no joint deformities,  effusion, or inflammation and no edema  Neurologic Examination: Mental Status: Alert, oriented, thought content appropriate.  Speech fluent without evidence of aphasia. Able to follow commands without difficulty. Cranial Nerves: II-Visual fields were normal. III/IV/VI-Pupils were equal and reacted normally to light. Extraocular movements were full and conjugate.    V/VII-no facial numbness and no facial weakness. VIII-normal. X-normal speech and symmetrical palatal movement. XI: trapezius strength/neck flexion strength normal bilaterally XII-midline tongue extension with normal strength. Motor: 5/5 bilaterally with normal tone and bulk Sensory: Normal throughout. Deep Tendon Reflexes: 2+ and symmetric. Plantars: Flexor bilaterally Cerebellar: Normal finger-to-nose testing. Carotid auscultation: Right carotid bruit of moderate intensity noted.  Results for orders placed or performed during the hospital encounter of 04/07/16 (from the past 48 hour(s))  CBC with Differential     Status: Abnormal   Collection Time: 04/07/16  8:05 PM  Result Value Ref Range   WBC 12.2 (H) 4.0 - 10.5 K/uL   RBC 5.00 4.22 - 5.81 MIL/uL   Hemoglobin 15.0 13.0 - 17.0 g/dL   HCT 42.5 39.0 - 52.0 %   MCV 85.0 78.0 - 100.0 fL   MCH 30.0 26.0 - 34.0 pg   MCHC 35.3 30.0 - 36.0 g/dL   RDW 13.1 11.5 - 15.5 %   Platelets 272 150 - 400 K/uL   Neutrophils Relative % 51 %   Neutro Abs 6.3 1.7 - 7.7 K/uL   Lymphocytes Relative 35 %   Lymphs Abs 4.2 (H) 0.7 - 4.0 K/uL   Monocytes Relative 8 %   Monocytes Absolute 1.0 0.1 - 1.0 K/uL   Eosinophils Relative 5 %   Eosinophils Absolute 0.6 0.0 - 0.7 K/uL   Basophils Relative 1 %   Basophils Absolute 0.1 0.0 - 0.1 K/uL  Comprehensive metabolic panel     Status: Abnormal   Collection Time: 04/07/16  8:05 PM  Result Value Ref Range   Sodium 139 135 - 145 mmol/L   Potassium 3.9 3.5 - 5.1 mmol/L   Chloride 103 101 - 111 mmol/L   CO2 26 22 - 32 mmol/L   Glucose, Bld  91 65 - 99 mg/dL   BUN 11 6 - 20 mg/dL   Creatinine, Ser 1.15 0.61 - 1.24 mg/dL   Calcium 9.1 8.9 - 10.3 mg/dL   Total Protein 6.7 6.5 - 8.1 g/dL   Albumin 4.0 3.5 - 5.0 g/dL   AST 15 15 - 41 U/L   ALT 14 (L) 17 - 63 U/L   Alkaline Phosphatase 40 38 - 126 U/L   Total Bilirubin 0.8 0.3 - 1.2 mg/dL   GFR calc non Af Amer >60 >60 mL/min   GFR calc Af Amer >60 >60 mL/min    Comment: (NOTE) The eGFR has been calculated using the CKD EPI equation. This calculation has not been validated in all clinical situations. eGFR's persistently <60 mL/min signify possible Chronic Kidney Disease.    Anion gap 10 5 - 15  Urinalysis, Routine w reflex microscopic (not at [facility])     Status: None   Collection Time: 04/07/16  8:11 PM  Result Value Ref Range   Color, Urine YELLOW YELLOW   APPearance CLEAR CLEAR   Specific Gravity, Urine 1.028 1.005 - 1.030   pH 6.5 5.0 - 8.0   Glucose, UA NEGATIVE NEGATIVE mg/dL   Hgb urine dipstick NEGATIVE NEGATIVE   Bilirubin Urine NEGATIVE NEGATIVE  Ketones, ur NEGATIVE NEGATIVE mg/dL   Protein, ur NEGATIVE NEGATIVE mg/dL   Nitrite NEGATIVE NEGATIVE   Leukocytes, UA NEGATIVE NEGATIVE    Comment: MICROSCOPIC NOT DONE ON URINES WITH NEGATIVE PROTEIN, BLOOD, LEUKOCYTES, NITRITE, OR GLUCOSE <1000 mg/dL.   Ct Angio Head W Or Wo Contrast  Result Date: 04/07/2016 CLINICAL DATA:  Right-sided neck pain and headache. Nausea and vomiting. Migraines. EXAM: CT ANGIOGRAPHY HEAD AND NECK TECHNIQUE: Multidetector CT imaging of the head and neck was performed using the standard protocol during bolus administration of intravenous contrast. Multiplanar CT image reconstructions and MIPs were obtained to evaluate the vascular anatomy. Carotid stenosis measurements (when applicable) are obtained utilizing NASCET criteria, using the distal internal carotid diameter as the denominator. CONTRAST:  50 mL Isovue 370 IV COMPARISON:  None. FINDINGS: CT HEAD FINDINGS Brain: No mass lesion,  intraparenchymal hemorrhage or extra-axial collection. No evidence of acute cortical infarct. There is atrophy of the brain greater than expected for age. Vascular: No hyperdense vessel or unexpected calcification. Skull: Normal visualized skull base, calvarium and extracranial soft tissues. Sinuses/Orbits: Left maxillary retention cysts. Normal orbits. Review of the MIP images confirms the above findings CTA NECK FINDINGS Aortic arch: There is a normal variant aortic arch branching pattern with the brachiocephalic and left common carotid arteries sharing a common origin. Proximal subclavian arteries are normal. No aneurysm or dissection. Right carotid system: There is eccentric plaque within the proximal left internal carotid artery resulting in qualitatively near critical stenosis. By NASCET criteria, the stenosis measures 85%. The remainder of the right internal carotid artery is patent and of normal caliber. Left carotid system: There is mostly noncalcified plaque at the left ICA origin resulting in approximately 70% stenosis. Remainder of the left internal carotid artery is normal. Vertebral arteries: Both vertebral artery origins are widely patent. The vertebral system is codominant. Both vertebral arteries are normal to their confluence with the basilar artery. Skeleton: Normal Other neck: The nasopharynx is clear. The oropharynx and hypopharynx are normal. The epiglottis is normal. The supraglottic larynx, glottis and subglottic larynx are normal. No retropharyngeal collection. The parapharyngeal spaces are preserved. The parotid and submandibular glands are normal. No sialolithiasis or salivary ductal dilatation. The thyroid gland is normal. There is no cervical lymphadenopathy. Upper chest: There incompletely visualized ground-glass opacities within the posterior segment of the right upper lobe. Review of the MIP images confirms the above findings CTA HEAD FINDINGS Anterior circulation: --Intracranial  internal carotid arteries: Normal. --Anterior cerebral arteries: Normal. --Middle cerebral arteries: Normal. --Posterior communicating arteries: Absent bilaterally. Posterior circulation: --Posterior cerebral arteries: Normal. --Superior cerebellar arteries: Normal. --Basilar artery: Normal. --Anterior inferior cerebellar arteries: Normal. --Posterior inferior cerebellar arteries: Normal. Venous sinuses: As permitted by contrast timing, patent. Anatomic variants: None Delayed phase: No abnormal intracranial enhancement. Review of the MIP images confirms the above findings IMPRESSION: 1. 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 2. Moderately age advanced cerebral atrophy. 3. No intracranial arterial occlusion or high-grade stenosis. No acute intracranial abnormality. 4. Ground-glass opacities within the right upper lobe, concerning for infection. Further characterization with dedicated chest radiography or chest CT without contrast is recommended. These results were called by telephone at the time of interpretation on 04/07/2016 at 11:51 pm to Dr. Tomi Bamberger, who verbally acknowledged these results. Electronically Signed   By: Ulyses Jarred M.D.   On: 04/07/2016 23:53   Ct Angio Neck W And/or Wo Contrast  Result  Date: 04/07/2016 CLINICAL DATA:  Right-sided neck pain and headache. Nausea and vomiting. Migraines. EXAM: CT ANGIOGRAPHY HEAD AND NECK TECHNIQUE: Multidetector CT imaging of the head and neck was performed using the standard protocol during bolus administration of intravenous contrast. Multiplanar CT image reconstructions and MIPs were obtained to evaluate the vascular anatomy. Carotid stenosis measurements (when applicable) are obtained utilizing NASCET criteria, using the distal internal carotid diameter as the denominator. CONTRAST:  50 mL Isovue 370 IV COMPARISON:  None. FINDINGS: CT HEAD  FINDINGS Brain: No mass lesion, intraparenchymal hemorrhage or extra-axial collection. No evidence of acute cortical infarct. There is atrophy of the brain greater than expected for age. Vascular: No hyperdense vessel or unexpected calcification. Skull: Normal visualized skull base, calvarium and extracranial soft tissues. Sinuses/Orbits: Left maxillary retention cysts. Normal orbits. Review of the MIP images confirms the above findings CTA NECK FINDINGS Aortic arch: There is a normal variant aortic arch branching pattern with the brachiocephalic and left common carotid arteries sharing a common origin. Proximal subclavian arteries are normal. No aneurysm or dissection. Right carotid system: There is eccentric plaque within the proximal left internal carotid artery resulting in qualitatively near critical stenosis. By NASCET criteria, the stenosis measures 85%. The remainder of the right internal carotid artery is patent and of normal caliber. Left carotid system: There is mostly noncalcified plaque at the left ICA origin resulting in approximately 70% stenosis. Remainder of the left internal carotid artery is normal. Vertebral arteries: Both vertebral artery origins are widely patent. The vertebral system is codominant. Both vertebral arteries are normal to their confluence with the basilar artery. Skeleton: Normal Other neck: The nasopharynx is clear. The oropharynx and hypopharynx are normal. The epiglottis is normal. The supraglottic larynx, glottis and subglottic larynx are normal. No retropharyngeal collection. The parapharyngeal spaces are preserved. The parotid and submandibular glands are normal. No sialolithiasis or salivary ductal dilatation. The thyroid gland is normal. There is no cervical lymphadenopathy. Upper chest: There incompletely visualized ground-glass opacities within the posterior segment of the right upper lobe. Review of the MIP images confirms the above findings CTA HEAD FINDINGS Anterior  circulation: --Intracranial internal carotid arteries: Normal. --Anterior cerebral arteries: Normal. --Middle cerebral arteries: Normal. --Posterior communicating arteries: Absent bilaterally. Posterior circulation: --Posterior cerebral arteries: Normal. --Superior cerebellar arteries: Normal. --Basilar artery: Normal. --Anterior inferior cerebellar arteries: Normal. --Posterior inferior cerebellar arteries: Normal. Venous sinuses: As permitted by contrast timing, patent. Anatomic variants: None Delayed phase: No abnormal intracranial enhancement. Review of the MIP images confirms the above findings IMPRESSION: 1. 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 2. Moderately age advanced cerebral atrophy. 3. No intracranial arterial occlusion or high-grade stenosis. No acute intracranial abnormality. 4. Ground-glass opacities within the right upper lobe, concerning for infection. Further characterization with dedicated chest radiography or chest CT without contrast is recommended. These results were called by telephone at the time of interpretation on 04/07/2016 at 11:51 pm to Dr. Tomi Bamberger, who verbally acknowledged these results. Electronically Signed   By: Ulyses Jarred M.D.   On: 04/07/2016 23:53   Ct Chest Wo Contrast  Result Date: 04/08/2016 CLINICAL DATA:  Abnormal upper lungs on CTA of the neck EXAM: CT CHEST WITHOUT CONTRAST TECHNIQUE: Multidetector CT imaging of the chest was performed following the standard protocol without IV contrast. COMPARISON:  04/07/2016 FINDINGS: Cardiovascular: Limited without intravenous contrast. The aorta is non aneurysmal. Heart size is nonenlarged. No pericardial effusion. Mediastinum/Nodes:  Triangular-shaped soft tissue density in the anterior mediastinum, likely reflects residual thymic tissue, slightly prominent for age but without masslike features. Thyroid gland is  unremarkable. Trachea and mainstem bronchi appear within normal limits. Sub cm nonspecific mediastinal lymph nodes. Limited assessment for hilar nodes without contrast. No axillary adenopathy. Esophagus is within normal limits. Lungs/Pleura: No pleural effusion. Mild ground-glass densities are present within the subpleural and posterior right upper lobe. Lung fields are otherwise clear. No effusion. Upper Abdomen: Residual contrast within the renal collecting systems. No acute abnormalities. Musculoskeletal: No chest wall mass or suspicious bone lesions identified. IMPRESSION: 1. Mild ground-glass densities within the subpleural and posterior right upper lobe. Findings are nonspecific but could be due to respiratory infection or nonspecific foci of pneumonitis. Lung fields are otherwise clear. 2. Mildly prominent residual thymus tissue without masslike features. Electronically Signed   By: Donavan Foil M.D.   On: 04/08/2016 00:46    Assessment: 36 y.o. male presenting with recurrent transient ischemic attacks involving right MCA territory with transient left upper extremity weakness, as well as severe stenosis of proximal right ICA and moderately severe stenosis of left ICA.  Stroke Risk Factors - hypertension, bilateral carotid stenosis  Plan: 1. HgbA1c, fasting lipid panel 2. MRI of the brain without contrast 3. Interventional Radiology consult 4. Echocardiogram 5. Carotid dopplers 6. Prophylactic therapy-Antiplatelet med: Aspirin  7. Risk factor modification 8. Telemetry monitoring 9. Hypercoagulopathy panel  C.R. Nicole Kindred, MD [facility] [phone]  04/08/2016, 1:47 AM

## 2016-04-08 NOTE — Progress Notes (Signed)
*  PRELIMINARY RESULTS* Vascular Ultrasound Carotid Duplex (Doppler) has been completed.  Preliminary findings: Right 80-99% ICA stenosis. Left 60-79% ICA stenosis. Antegrade vertebral flow.  Farrel DemarkJill Eunice, RDMS, RVT  04/08/2016, 3:22 PM

## 2016-04-08 NOTE — Discharge Summary (Signed)
PATIENT DETAILS Name: Edwin Roberson Age: 35 y.o. Sex: male Date of Birth: 03-21-1981 MRN: [id number]. Admitting Physician: Michael LitterNikki Carter, MD WUJ:WJXBJYN,WGNFAPCP:RICHTER,KAREN L., MD  Admit Date: 04/07/2016 Discharge date: 04/08/2016  Recommendations for Outpatient Follow-up:  1. Follow up with PCP in 1-2 weeks 2. Please obtain BMP/CBC in one week   Admitted From:  Home  [facility]    Home Health: No  Equipment/Devices: None  Discharge Condition: Stable  CODE STATUS: FULL CODE  Diet recommendation:  Heart Healthy   Brief Summary: See H&P, Labs, Consult and Test reports for all details in brief, patient is a young 35 year old male who has had hypertension for the past 20 years, admitted for left-sided weakness. Found to have acute CVA. See below for details   Brief Hospital Course: Acute CVA: No focal deficits on exam, 2 tiny infarcts in the right frontal and right parietal area and the MRI. CT angiogram of the neck shows 89% stenosis on the right internal carotid, and 70% stenosis on the left. LDL 91, continue aspirin and statin. Patient and family at bedside [facility] of carotid artery stenosis and further workup. Subsequently spoke with Dr. Kevin FentonGabriela Velasquez (vascular surgery) at patient's request- [facility]she has accepted the patient.   History of migraines: Headache free, neurology started Depakote and Relpax.  History of anxiety/ADHD: Resume adderall on discharge   Procedures/Studies: None  Discharge Diagnoses:  Principal Problem:   Carotid artery disease (HCC) Active Problems:   HTN (hypertension)   TIA (transient ischemic attack)   Anxiety   Migraine headache   Numbness and tingling in left hand   Near syncope   Acute CVA (cerebrovascular accident) [facility](HCC)   Discharge Instructions:  Activity:  As tolerated with Full fall precautions  use walker/cane & assistance as needed  Discharge Instructions    Diet - low sodium heart healthy    Complete by:  As directed    Increase activity slowly    Complete by:  As directed        Medication List    STOP taking these medications   ibuprofen 600 MG tablet Commonly known as:  ADVIL,MOTRIN   naproxen sodium 220 MG tablet Commonly known as:  ANAPROX     TAKE these medications   amphetamine-dextroamphetamine 30 MG 24 hr capsule Commonly known as:  ADDERALL XR Take 30 mg by mouth daily.   aspirin 325 MG tablet Take 1 tablet (325 mg total) by mouth daily. Start taking on:  04/09/2016   atorvastatin 40 MG tablet Commonly known as:  LIPITOR Take 1 tablet (40 mg total) by mouth daily at 6 PM.   carvedilol 6.25 MG tablet Commonly known as:  COREG Take 6.25 mg by mouth 2 (two) times daily with a meal.   divalproex 500 MG 24 hr tablet Commonly known as:  DEPAKOTE ER Take 1 tablet (500 mg total) by mouth daily. Start taking on:  04/09/2016   losartan-hydrochlorothiazide 100-25 MG tablet Commonly known as:  HYZAAR Take 1 tablet by mouth daily. Reported on 06/12/2015      Follow-up Information    RICHTER,KAREN L., MD. Schedule an appointment as soon as possible for a visit in 2 week(s).   Specialty:  Family Medicine Contact information: [address] [place] [id number] [phone]          Allergies  Allergen Reactions  . Tramadol Other (See Comments)    Pt felt out  of it    Consultations:   neurology  Other Procedures/Studies: Ct Angio Head W Or Wo Contrast  Result Date: 04/07/2016 CLINICAL DATA:  Right-sided neck pain and headache. Nausea and vomiting. Migraines. EXAM: CT ANGIOGRAPHY HEAD AND NECK TECHNIQUE: Multidetector CT imaging of the head and neck was performed using the standard protocol during bolus administration of intravenous contrast. Multiplanar CT image reconstructions and MIPs were obtained to evaluate the vascular anatomy.  Carotid stenosis measurements (when applicable) are obtained utilizing NASCET criteria, using the distal internal carotid diameter as the denominator. CONTRAST:  50 mL Isovue 370 IV COMPARISON:  None. FINDINGS: CT HEAD FINDINGS Brain: No mass lesion, intraparenchymal hemorrhage or extra-axial collection. No evidence of acute cortical infarct. There is atrophy of the brain greater than expected for age. Vascular: No hyperdense vessel or unexpected calcification. Skull: Normal visualized skull base, calvarium and extracranial soft tissues. Sinuses/Orbits: Left maxillary retention cysts. Normal orbits. Review of the MIP images confirms the above findings CTA NECK FINDINGS Aortic arch: There is a normal variant aortic arch branching pattern with the brachiocephalic and left common carotid arteries sharing a common origin. Proximal subclavian arteries are normal. No aneurysm or dissection. Right carotid system: There is eccentric plaque within the proximal left internal carotid artery resulting in qualitatively near critical stenosis. By NASCET criteria, the stenosis measures 85%. The remainder of the right internal carotid artery is patent and of normal caliber. Left carotid system: There is mostly noncalcified plaque at the left ICA origin resulting in approximately 70% stenosis. Remainder of the left internal carotid artery is normal. Vertebral arteries: Both vertebral artery origins are widely patent. The vertebral system is codominant. Both vertebral arteries are normal to their confluence with the basilar artery. Skeleton: Normal Other neck: The nasopharynx is clear. The oropharynx and hypopharynx are normal. The epiglottis is normal. The supraglottic larynx, glottis and subglottic larynx are normal. No retropharyngeal collection. The parapharyngeal spaces are preserved. The parotid and submandibular glands are normal. No sialolithiasis or salivary ductal dilatation. The thyroid gland is normal. There is no  cervical lymphadenopathy. Upper chest: There incompletely visualized ground-glass opacities within the posterior segment of the right upper lobe. Review of the MIP images confirms the above findings CTA HEAD FINDINGS Anterior circulation: --Intracranial internal carotid arteries: Normal. --Anterior cerebral arteries: Normal. --Middle cerebral arteries: Normal. --Posterior communicating arteries: Absent bilaterally. Posterior circulation: --Posterior cerebral arteries: Normal. --Superior cerebellar arteries: Normal. --Basilar artery: Normal. --Anterior inferior cerebellar arteries: Normal. --Posterior inferior cerebellar arteries: Normal. Venous sinuses: As permitted by contrast timing, patent. Anatomic variants: None Delayed phase: No abnormal intracranial enhancement. Review of the MIP images confirms the above findings IMPRESSION: 1. 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 2. Moderately age advanced cerebral atrophy. 3. No intracranial arterial occlusion or high-grade stenosis. No acute intracranial abnormality. 4. Ground-glass opacities within the right upper lobe, concerning for infection. Further characterization with dedicated chest radiography or chest CT without contrast is recommended. These results were called by telephone at the time of interpretation on 04/07/2016 at 11:51 pm to Dr. Lynelle DoctorKnapp, who verbally acknowledged these results. Electronically Signed   By: Deatra RobinsonKevin  Herman M.D.   On: 04/07/2016 23:53   Ct Angio Neck W And/or Wo Contrast  Result Date: 04/07/2016 CLINICAL DATA:  Right-sided neck pain and headache. Nausea and vomiting. Migraines. EXAM: CT ANGIOGRAPHY HEAD AND NECK TECHNIQUE: Multidetector CT imaging of the head and neck was performed using  the standard protocol during bolus administration of intravenous contrast. Multiplanar CT image reconstructions and MIPs were obtained to  evaluate the vascular anatomy. Carotid stenosis measurements (when applicable) are obtained utilizing NASCET criteria, using the distal internal carotid diameter as the denominator. CONTRAST:  50 mL Isovue 370 IV COMPARISON:  None. FINDINGS: CT HEAD FINDINGS Brain: No mass lesion, intraparenchymal hemorrhage or extra-axial collection. No evidence of acute cortical infarct. There is atrophy of the brain greater than expected for age. Vascular: No hyperdense vessel or unexpected calcification. Skull: Normal visualized skull base, calvarium and extracranial soft tissues. Sinuses/Orbits: Left maxillary retention cysts. Normal orbits. Review of the MIP images confirms the above findings CTA NECK FINDINGS Aortic arch: There is a normal variant aortic arch branching pattern with the brachiocephalic and left common carotid arteries sharing a common origin. Proximal subclavian arteries are normal. No aneurysm or dissection. Right carotid system: There is eccentric plaque within the proximal left internal carotid artery resulting in qualitatively near critical stenosis. By NASCET criteria, the stenosis measures 85%. The remainder of the right internal carotid artery is patent and of normal caliber. Left carotid system: There is mostly noncalcified plaque at the left ICA origin resulting in approximately 70% stenosis. Remainder of the left internal carotid artery is normal. Vertebral arteries: Both vertebral artery origins are widely patent. The vertebral system is codominant. Both vertebral arteries are normal to their confluence with the basilar artery. Skeleton: Normal Other neck: The nasopharynx is clear. The oropharynx and hypopharynx are normal. The epiglottis is normal. The supraglottic larynx, glottis and subglottic larynx are normal. No retropharyngeal collection. The parapharyngeal spaces are preserved. The parotid and submandibular glands are normal. No sialolithiasis or salivary ductal dilatation. The thyroid gland  is normal. There is no cervical lymphadenopathy. Upper chest: There incompletely visualized ground-glass opacities within the posterior segment of the right upper lobe. Review of the MIP images confirms the above findings CTA HEAD FINDINGS Anterior circulation: --Intracranial internal carotid arteries: Normal. --Anterior cerebral arteries: Normal. --Middle cerebral arteries: Normal. --Posterior communicating arteries: Absent bilaterally. Posterior circulation: --Posterior cerebral arteries: Normal. --Superior cerebellar arteries: Normal. --Basilar artery: Normal. --Anterior inferior cerebellar arteries: Normal. --Posterior inferior cerebellar arteries: Normal. Venous sinuses: As permitted by contrast timing, patent. Anatomic variants: None Delayed phase: No abnormal intracranial enhancement. Review of the MIP images confirms the above findings IMPRESSION: 1. 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 2. Moderately age advanced cerebral atrophy. 3. No intracranial arterial occlusion or high-grade stenosis. No acute intracranial abnormality. 4. Ground-glass opacities within the right upper lobe, concerning for infection. Further characterization with dedicated chest radiography or chest CT without contrast is recommended. These results were called by telephone at the time of interpretation on 04/07/2016 at 11:51 pm to Dr. Lynelle Doctor, who verbally acknowledged these results. Electronically Signed   By: Deatra Robinson M.D.   On: 04/07/2016 23:53   Ct Chest Wo Contrast  Result Date: 04/08/2016 CLINICAL DATA:  Abnormal upper lungs on CTA of the neck EXAM: CT CHEST WITHOUT CONTRAST TECHNIQUE: Multidetector CT imaging of the chest was performed following the standard protocol without IV contrast. COMPARISON:  04/07/2016 FINDINGS: Cardiovascular: Limited without intravenous contrast. The aorta is non aneurysmal.  Heart size is nonenlarged. No pericardial effusion. Mediastinum/Nodes: Triangular-shaped soft tissue density in the anterior mediastinum, likely reflects residual thymic tissue, slightly prominent for age but without masslike features. Thyroid gland is unremarkable. Trachea and mainstem bronchi appear within  normal limits. Sub cm nonspecific mediastinal lymph nodes. Limited assessment for hilar nodes without contrast. No axillary adenopathy. Esophagus is within normal limits. Lungs/Pleura: No pleural effusion. Mild ground-glass densities are present within the subpleural and posterior right upper lobe. Lung fields are otherwise clear. No effusion. Upper Abdomen: Residual contrast within the renal collecting systems. No acute abnormalities. Musculoskeletal: No chest wall mass or suspicious bone lesions identified. IMPRESSION: 1. Mild ground-glass densities within the subpleural and posterior right upper lobe. Findings are nonspecific but could be due to respiratory infection or nonspecific foci of pneumonitis. Lung fields are otherwise clear. 2. Mildly prominent residual thymus tissue without masslike features. Electronically Signed   By: Jasmine Pang M.D.   On: 04/08/2016 00:46   Mr Brain Wo Contrast  Result Date: 04/08/2016 CLINICAL DATA:  TIA.  Headache with left arm numbness and heaviness. EXAM: MRI HEAD WITHOUT CONTRAST TECHNIQUE: Multiplanar, multiecho pulse sequences of the brain and surrounding structures were obtained without intravenous contrast. COMPARISON:  CTA head neck from yesterday FINDINGS: Brain: 2 sub cm foci of acute infarction in the anterior right frontal and right parietal cortex. No hemorrhage superimposed. No other convincing site of acute infarct. Ventriculomegaly attributed to diminished cerebral white matter volume. No lateral ventricular scalloping. Subtle periventricular nodular densities which has signal similar to gray matter. None of these are calcified by CT or MRI and there is  no signal abnormality elsewhere in the brain. 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 No hydrocephalus, mass, or Vascular: Normal flow voids. Skull and upper cervical spine: Negative Sinuses/Orbits: Mucous retention cysts in the left maxillary sinus. No acute finding. IMPRESSION: 1. Two subcentimeter foci of acute infarction in the right frontal and parietal cortex. 2. 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 Electronically Signed   By: Marnee Spring M.D.   On: 04/08/2016 09:17      TODAY-DAY OF DISCHARGE:  Subjective:   Nicolus Ose today has no headache,no chest abdominal pain,no new weakness tingling or numbness, feels much better wants to go home today.   Objective:   Blood pressure 127/79, pulse 62, temperature 98 F (36.7 C), temperature source Oral, resp. rate 18, height 5\' 6"  (1.676 m), weight 85.9 kg (189 lb 6 oz), SpO2 97 %. No intake or output data in the 24 hours ending 04/08/16 1535 Filed Weights   04/07/16 1949 04/08/16 0314  Weight: 81.6 kg (180 lb) 85.9 kg (189 lb 6 oz)    Exam: Awake Alert, Oriented *3, No new F.N deficits, Normal affect [place].AT,PERRAL Supple Neck,No JVD, No cervical lymphadenopathy appriciated.  Symmetrical Chest wall movement, Good air movement bilaterally, CTAB RRR,No Gallops,Rubs or new Murmurs, No Parasternal Heave +ve B.Sounds, Abd Soft, Non tender, No organomegaly appriciated, No rebound -guarding or rigidity. No Cyanosis, Clubbing or edema, No new Rash or bruise   PERTINENT RADIOLOGIC STUDIES: Ct Angio Head W Or Wo Contrast  Result Date: 04/07/2016 CLINICAL DATA:  Right-sided neck pain and headache. Nausea and vomiting. Migraines. EXAM: CT ANGIOGRAPHY HEAD AND NECK TECHNIQUE: Multidetector CT imaging of the head and neck was performed using the standard protocol during bolus administration of intravenous contrast. Multiplanar CT image reconstructions and MIPs were  obtained to evaluate the vascular anatomy. Carotid stenosis measurements (when applicable) are obtained utilizing NASCET criteria, using the distal internal carotid diameter as the denominator. CONTRAST:  50 mL Isovue 370 IV COMPARISON:  None. FINDINGS: CT HEAD FINDINGS Brain: No mass lesion, intraparenchymal hemorrhage or extra-axial collection. No evidence of  acute cortical infarct. There is atrophy of the brain greater than expected for age. Vascular: No hyperdense vessel or unexpected calcification. Skull: Normal visualized skull base, calvarium and extracranial soft tissues. Sinuses/Orbits: Left maxillary retention cysts. Normal orbits. Review of the MIP images confirms the above findings CTA NECK FINDINGS Aortic arch: There is a normal variant aortic arch branching pattern with the brachiocephalic and left common carotid arteries sharing a common origin. Proximal subclavian arteries are normal. No aneurysm or dissection. Right carotid system: There is eccentric plaque within the proximal left internal carotid artery resulting in qualitatively near critical stenosis. By NASCET criteria, the stenosis measures 85%. The remainder of the right internal carotid artery is patent and of normal caliber. Left carotid system: There is mostly noncalcified plaque at the left ICA origin resulting in approximately 70% stenosis. Remainder of the left internal carotid artery is normal. Vertebral arteries: Both vertebral artery origins are widely patent. The vertebral system is codominant. Both vertebral arteries are normal to their confluence with the basilar artery. Skeleton: Normal Other neck: The nasopharynx is clear. The oropharynx and hypopharynx are normal. The epiglottis is normal. The supraglottic larynx, glottis and subglottic larynx are normal. No retropharyngeal collection. The parapharyngeal spaces are preserved. The parotid and submandibular glands are normal. No sialolithiasis or salivary ductal dilatation. The  thyroid gland is normal. There is no cervical lymphadenopathy. Upper chest: There incompletely visualized ground-glass opacities within the posterior segment of the right upper lobe. Review of the MIP images confirms the above findings CTA HEAD FINDINGS Anterior circulation: --Intracranial internal carotid arteries: Normal. --Anterior cerebral arteries: Normal. --Middle cerebral arteries: Normal. --Posterior communicating arteries: Absent bilaterally. Posterior circulation: --Posterior cerebral arteries: Normal. --Superior cerebellar arteries: Normal. --Basilar artery: Normal. --Anterior inferior cerebellar arteries: Normal. --Posterior inferior cerebellar arteries: Normal. Venous sinuses: As permitted by contrast timing, patent. Anatomic variants: None Delayed phase: No abnormal intracranial enhancement. Review of the MIP images confirms the above findings IMPRESSION: 1. 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 2. Moderately age advanced cerebral atrophy. 3. No intracranial arterial occlusion or high-grade stenosis. No acute intracranial abnormality. 4. Ground-glass opacities within the right upper lobe, concerning for infection. Further characterization with dedicated chest radiography or chest CT without contrast is recommended. These results were called by telephone at the time of interpretation on 04/07/2016 at 11:51 pm to Dr. Lynelle Doctor, who verbally acknowledged these results. Electronically Signed   By: Deatra Robinson M.D.   On: 04/07/2016 23:53   Ct Angio Neck W And/or Wo Contrast  Result Date: 04/07/2016 CLINICAL DATA:  Right-sided neck pain and headache. Nausea and vomiting. Migraines. EXAM: CT ANGIOGRAPHY HEAD AND NECK TECHNIQUE: Multidetector CT imaging of the head and neck was performed using the standard protocol during bolus administration of intravenous contrast. Multiplanar CT image  reconstructions and MIPs were obtained to evaluate the vascular anatomy. Carotid stenosis measurements (when applicable) are obtained utilizing NASCET criteria, using the distal internal carotid diameter as the denominator. CONTRAST:  50 mL Isovue 370 IV COMPARISON:  None. FINDINGS: CT HEAD FINDINGS Brain: No mass lesion, intraparenchymal hemorrhage or extra-axial collection. No evidence of acute cortical infarct. There is atrophy of the brain greater than expected for age. Vascular: No hyperdense vessel or unexpected calcification. Skull: Normal visualized skull base, calvarium and extracranial soft tissues. Sinuses/Orbits: Left maxillary retention cysts. Normal orbits. Review of the MIP images confirms the above findings CTA NECK FINDINGS Aortic arch: There  is a normal variant aortic arch branching pattern with the brachiocephalic and left common carotid arteries sharing a common origin. Proximal subclavian arteries are normal. No aneurysm or dissection. Right carotid system: There is eccentric plaque within the proximal left internal carotid artery resulting in qualitatively near critical stenosis. By NASCET criteria, the stenosis measures 85%. The remainder of the right internal carotid artery is patent and of normal caliber. Left carotid system: There is mostly noncalcified plaque at the left ICA origin resulting in approximately 70% stenosis. Remainder of the left internal carotid artery is normal. Vertebral arteries: Both vertebral artery origins are widely patent. The vertebral system is codominant. Both vertebral arteries are normal to their confluence with the basilar artery. Skeleton: Normal Other neck: The nasopharynx is clear. The oropharynx and hypopharynx are normal. The epiglottis is normal. The supraglottic larynx, glottis and subglottic larynx are normal. No retropharyngeal collection. The parapharyngeal spaces are preserved. The parotid and submandibular glands are normal. No sialolithiasis or  salivary ductal dilatation. The thyroid gland is normal. There is no cervical lymphadenopathy. Upper chest: There incompletely visualized ground-glass opacities within the posterior segment of the right upper lobe. Review of the MIP images confirms the above findings CTA HEAD FINDINGS Anterior circulation: --Intracranial internal carotid arteries: Normal. --Anterior cerebral arteries: Normal. --Middle cerebral arteries: Normal. --Posterior communicating arteries: Absent bilaterally. Posterior circulation: --Posterior cerebral arteries: Normal. --Superior cerebellar arteries: Normal. --Basilar artery: Normal. --Anterior inferior cerebellar arteries: Normal. --Posterior inferior cerebellar arteries: Normal. Venous sinuses: As permitted by contrast timing, patent. Anatomic variants: None Delayed phase: No abnormal intracranial enhancement. Review of the MIP images confirms the above findings IMPRESSION: 1. 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 2. Moderately age advanced cerebral atrophy. 3. No intracranial arterial occlusion or high-grade stenosis. No acute intracranial abnormality. 4. Ground-glass opacities within the right upper lobe, concerning for infection. Further characterization with dedicated chest radiography or chest CT without contrast is recommended. These results were called by telephone at the time of interpretation on 04/07/2016 at 11:51 pm to Dr. Lynelle Doctor, who verbally acknowledged these results. Electronically Signed   By: Deatra Robinson M.D.   On: 04/07/2016 23:53   Ct Chest Wo Contrast  Result Date: 04/08/2016 CLINICAL DATA:  Abnormal upper lungs on CTA of the neck EXAM: CT CHEST WITHOUT CONTRAST TECHNIQUE: Multidetector CT imaging of the chest was performed following the standard protocol without IV contrast. COMPARISON:  04/07/2016 FINDINGS: Cardiovascular: Limited without  intravenous contrast. The aorta is non aneurysmal. Heart size is nonenlarged. No pericardial effusion. Mediastinum/Nodes: Triangular-shaped soft tissue density in the anterior mediastinum, likely reflects residual thymic tissue, slightly prominent for age but without masslike features. Thyroid gland is unremarkable. Trachea and mainstem bronchi appear within normal limits. Sub cm nonspecific mediastinal lymph nodes. Limited assessment for hilar nodes without contrast. No axillary adenopathy. Esophagus is within normal limits. Lungs/Pleura: No pleural effusion. Mild ground-glass densities are present within the subpleural and posterior right upper lobe. Lung fields are otherwise clear. No effusion. Upper Abdomen: Residual contrast within the renal collecting systems. No acute abnormalities. Musculoskeletal: No chest wall mass or suspicious bone lesions identified. IMPRESSION: 1. Mild ground-glass densities within the subpleural and posterior right upper lobe. Findings are nonspecific but could be due to respiratory infection or nonspecific foci of pneumonitis. Lung fields are otherwise clear. 2. Mildly prominent residual thymus tissue without masslike features. Electronically Signed   By: Adrian Prows.D.  On: 04/08/2016 00:46   Mr Brain Wo Contrast  Result Date: 04/08/2016 CLINICAL DATA:  TIA.  Headache with left arm numbness and heaviness. EXAM: MRI HEAD WITHOUT CONTRAST TECHNIQUE: Multiplanar, multiecho pulse sequences of the brain and surrounding structures were obtained without intravenous contrast. COMPARISON:  CTA head neck from yesterday FINDINGS: Brain: 2 sub cm foci of acute infarction in the anterior right frontal and right parietal cortex. No hemorrhage superimposed. No other convincing site of acute infarct. Ventriculomegaly attributed to diminished cerebral white matter volume. No lateral ventricular scalloping. Subtle periventricular nodular densities which has signal similar to gray matter.  None of these are calcified by CT or MRI and there is no signal abnormality elsewhere in the brain. 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 No hydrocephalus, mass, or Vascular: Normal flow voids. Skull and upper cervical spine: Negative Sinuses/Orbits: Mucous retention cysts in the left maxillary sinus. No acute finding. IMPRESSION: 1. Two subcentimeter foci of acute infarction in the right frontal and parietal cortex. 2. 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 Electronically Signed   By: Marnee Spring M.D.   On: 04/08/2016 09:17     PERTINENT LAB RESULTS: CBC:  Recent Labs  04/07/16 2005  WBC 12.2*  HGB 15.0  HCT 42.5  PLT 272   CMET CMP     Component Value Date/Time   NA 139 04/07/2016 2005   K 3.9 04/07/2016 2005   CL 103 04/07/2016 2005   CO2 26 04/07/2016 2005   GLUCOSE 91 04/07/2016 2005   BUN 11 04/07/2016 2005   CREATININE 1.15 04/07/2016 2005   CALCIUM 9.1 04/07/2016 2005   PROT 6.7 04/07/2016 2005   ALBUMIN 4.0 04/07/2016 2005   AST 15 04/07/2016 2005   ALT 14 (L) 04/07/2016 2005   ALKPHOS 40 04/07/2016 2005   BILITOT 0.8 04/07/2016 2005   GFRNONAA >60 04/07/2016 2005   GFRAA >60 04/07/2016 2005    GFR Estimated Creatinine Clearance: 92.9 mL/min (by C-G formula based on SCr of 1.15 mg/dL). No results for input(s): LIPASE, AMYLASE in the last 72 hours. No results for input(s): CKTOTAL, CKMB, CKMBINDEX, TROPONINI in the last 72 hours. Invalid input(s): POCBNP No results for input(s): DDIMER in the last 72 hours. No results for input(s): HGBA1C in the last 72 hours.  Recent Labs  04/08/16 0330  CHOL 157  HDL 42  LDLCALC 91  TRIG 119  CHOLHDL 3.7   No results for input(s): TSH, T4TOTAL, T3FREE, THYROIDAB in the last 72 hours.  Invalid input(s): FREET3 No results for input(s): VITAMINB12, FOLATE, FERRITIN, TIBC, IRON, RETICCTPCT in the last 72 hours. Coags: No results for  input(s): INR in the last 72 hours.  Invalid input(s): PT Microbiology: No results found for this or any previous visit (from the past 240 hour(s)).  FURTHER DISCHARGE INSTRUCTIONS:  Get Medicines reviewed and adjusted: Please take all your medications with you for your next visit with your Primary MD  Laboratory/radiological data: Please request your Primary MD to go over all hospital tests and procedure/radiological results at the follow up, please ask your Primary MD to get all Hospital records sent to his/her office.  In some cases, they will be blood work, cultures and biopsy results pending at the time of your discharge. Please request that your primary care M.D. goes through all the records of your hospital data and follows up on these results.  Also Note the following: If you experience worsening of your admission symptoms, develop shortness  of breath, life threatening emergency, suicidal or homicidal thoughts you must seek medical attention immediately by calling 911 or calling your MD immediately  if symptoms less severe.  You must read complete instructions/literature along with all the possible adverse reactions/side effects for all the Medicines you take and that have been prescribed to you. Take any new Medicines after you have completely understood and accpet all the possible adverse reactions/side effects.   Do not drive when taking Pain medications or sleeping medications (Benzodaizepines)  Do not take more than prescribed Pain, Sleep and Anxiety Medications. It is not advisable to combine anxiety,sleep and pain medications without talking with your primary care practitioner  Special Instructions: If you have smoked or chewed Tobacco  in the last 2 yrs please stop smoking, stop any regular Alcohol  and or any Recreational drug use.  Wear Seat belts while driving.  Please note: You were cared for by a hospitalist during your hospital stay. Once you are discharged, your  primary care physician will handle any further medical issues. Please note that NO REFILLS for any discharge medications will be authorized once you are discharged, as it is imperative that you return to your primary care physician (or establish a relationship with a primary care physician if you do not have one) for your post hospital discharge needs so that they can reassess your need for medications and monitor your lab values.  Total Time spent coordinating discharge including counseling, education and face to face time equals 45 minutes.  SignedJeoffrey Massed 04/08/2016 3:35 PM

## 2016-04-08 NOTE — Evaluation (Signed)
Physical Therapy Evaluation and Discharge Patient Details Name: Edwin Roberson MRN: [id number] DOB: November 23, 1980 Today's Date: 04/08/2016   History of Present Illness  Pt is a 35 y/o male who presents s/p HA and numbness in L hand. CT of head showed no acute changes. MRI revealed 2 small R frontal and R parietal infarcts - possibly related to presence of prolonged migraine vs related to carotid disease. CT angiogram showed 80-90% stenosis of proximal internal carotid artery and 70% stenosis of left internal carotid.  Clinical Impression  Patient evaluated by Physical Therapy with no further acute PT needs identified. All education has been completed and the patient has no further questions. At the time of PT eval pt was able to perform transfers and ambulation with modified independence to independence. See below for any follow-up Physical Therapy or equipment needs. PT is signing off. Thank you for this referral.     Follow Up Recommendations No PT follow up;Supervision for mobility/OOB    Equipment Recommendations  None recommended by PT    Recommendations for Other Services       Precautions / Restrictions Precautions Precautions: None Restrictions Weight Bearing Restrictions: No      Mobility  Bed Mobility               General bed mobility comments: Pt received exiting the bathroom independently   Transfers Overall transfer level: Independent Equipment used: None                Ambulation/Gait Ambulation/Gait assistance: Independent Ambulation Distance (Feet): 500 Feet Assistive device: None Gait Pattern/deviations: WFL(Within Functional Limits)   Gait velocity interpretation: at or above normal speed for age/gender    Stairs Stairs: Yes Stairs assistance: Supervision Stair Management: Two rails;Alternating pattern;Forwards Number of Stairs: 5 General stair comments: Pt demonstrated no LOB or unsteadiness with stair negotiation.   Wheelchair  Mobility    Modified Rankin (Stroke Patients Only) Modified Rankin (Stroke Patients Only) Pre-Morbid Rankin Score: No symptoms Modified Rankin: No significant disability     Balance Overall balance assessment: No apparent balance deficits (not formally assessed)                               Standardized Balance Assessment Standardized Balance Assessment : Berg Balance Test Berg Balance Test Sit to Stand: Able to stand without using hands and stabilize independently Standing Unsupported: Able to stand safely 2 minutes Sitting with Back Unsupported but Feet Supported on Floor or Stool: Able to sit safely and securely 2 minutes Stand to Sit: Sits safely with minimal use of hands Transfers: Able to transfer safely, minor use of hands Standing Unsupported with Eyes Closed: Able to stand 10 seconds safely Standing Ubsupported with Feet Together: Able to place feet together independently and stand 1 minute safely From Standing, Reach Forward with Outstretched Arm: Can reach confidently >25 cm (10") From Standing Position, Pick up Object from Floor: Able to pick up shoe, needs supervision From Standing Position, Turn to Look Behind Over each Shoulder: Looks behind from both sides and weight shifts well Turn 360 Degrees: Able to turn 360 degrees safely in 4 seconds or less Standing Unsupported, Alternately Place Feet on Step/Stool: Able to stand independently and safely and complete 8 steps in 20 seconds Standing Unsupported, One Foot in Front: Able to place foot tandem independently and hold 30 seconds Standing on One Leg: Able to lift leg independently and hold > 10 seconds Total Score:  55         Pertinent Vitals/Pain Pain Assessment: No/denies pain Faces Pain Scale: No hurt    Home Living Family/patient expects to be discharged to:: Private residence Living Arrangements: Spouse/significant other Available Help at Discharge: Family;Available 24 hours/day Type of  Home: House Home Access: Stairs to enter Entrance Stairs-Rails: None Entrance Stairs-Number of Steps: 3 Home Layout: Two level Home Equipment: Wheelchair - manual      Prior Function Level of Independence: Independent               Hand Dominance        Extremity/Trunk Assessment   Upper Extremity Assessment: LUE deficits/detail       LUE Deficits / Details: Pt reports 2 episodes of L hand weakness/numbness/tingling. States this has resolved completely aside from mild sensation changes that are lingering in the L side. See OT note for more detail.    Lower Extremity Assessment: Overall WFL for tasks assessed      Cervical / Trunk Assessment: Normal  Communication   Communication: No difficulties  Cognition Arousal/Alertness: Awake/alert Behavior During Therapy: WFL for tasks assessed/performed Overall Cognitive Status: Within Functional Limits for tasks assessed                      General Comments      Exercises     Assessment/Plan    PT Assessment Patent does not need any further PT services  PT Problem List            PT Treatment Interventions      PT Goals (Current goals can be found in the Care Plan section)  Acute Rehab PT Goals Patient Stated Goal: Home today PT Goal Formulation: All assessment and education complete, DC therapy    Frequency     Barriers to discharge        Co-evaluation               End of Session Equipment Utilized During Treatment: Gait belt Activity Tolerance: Patient tolerated treatment well Patient left: in chair;with call bell/phone within reach Nurse Communication: Mobility status         Time: [id number] PT Time Calculation (min) (ACUTE ONLY): 27 min   Charges:   PT Evaluation $PT Eval Moderate Complexity: 1 Procedure PT Treatments $Gait Training: 8-22 mins   PT G Codes:        Marylynn PearsonLaura D Tykeria Wawrzyniak 04/08/2016, 3:02 PM  Conni SlipperLaura Rollan Roger, PT, DPT Acute Rehabilitation  Services Pager: [phone]

## 2016-04-08 NOTE — ED Notes (Signed)
Pt arrived in ED c/o headache and intermittent numbness in the left hand x36 hours. Pt had no numbness at triage. Pt passed the stroke swallow screen and had an abreviated NIH score of 0. Vitals are stable at this time. Pt has an elevated WBC count (12.2).

## 2016-04-08 NOTE — Progress Notes (Addendum)
STROKE TEAM PROGRESS NOTE   HISTORY OF PRESENT ILLNESS (per record) Edwin Roberson is an 35 y.o. male history of hypertension, migraine headaches and anxiety presenting with recurrent episodes of weakness involving his left upper extremity, particularly his left hand, as well as right-sided headache and pain involving right side of his neck. He has not experienced neck trauma. Onset of symptoms was on 04/05/2016. Patient likely pressed on the right side of his neck and became very lightheaded and felt that he might lose consciousness, and in addition he had marked weakness and heaviness of his left upper extremity which lasted about 1 minute. CT scan of his head showed no acute changes. Mild atrophic changes more advanced than expected for age were noted. CT angiogram showed 80-90% stenosis of proximal internal carotid artery and 70% stenosis of left internal carotid. CT angiogram of the head showed no large vessel occlusion or significant stenosis intracranially. Patient has not been on antiplatelet therapy routinely. NIH stroke score at the time of this evaluation was 0. He ws LKW at 6:30 PM on 04/07/2016. Patient was not administered IV t-PA secondary to deficits rapidly resolved. He was admitted for further evaluation and treatment.   SUBJECTIVE (INTERVAL HISTORY) His famlily is at the bedside.  Overall he feels his condition is unchanged. He complains of continued HA this am. Has received Vicodin without improvement. Uses ibuprofen at home. pts father currently in the ICU with CAD. Family describes increased pt stress.    OBJECTIVE Temp:  [97.7 F (36.5 C)-98.6 F (37 C)] 98.1 F (36.7 C) (11/29 0900) Pulse Rate:  [64-83] 64 (11/29 0700) Cardiac Rhythm: Normal sinus rhythm (11/29 0701) Resp:  [11-20] 18 (11/29 0900) BP: (112-146)/(54-85) 112/54 (11/29 0900) SpO2:  [92 %-99 %] 95 % (11/29 0900) Weight:  [81.6 kg (180 lb)-85.9 kg (189 lb 6 oz)] 85.9 kg (189 lb 6 oz) (11/29 0314)  CBC:    Recent Labs Lab 04/07/16 2005  WBC 12.2*  NEUTROABS 6.3  HGB 15.0  HCT 42.5  MCV 85.0  PLT 272    Basic Metabolic Panel:   Recent Labs Lab 04/07/16 2005  NA 139  K 3.9  CL 103  CO2 26  GLUCOSE 91  BUN 11  CREATININE 1.15  CALCIUM 9.1    Lipid Panel:     Component Value Date/Time   CHOL 157 04/08/2016 0330   TRIG 119 04/08/2016 0330   HDL 42 04/08/2016 0330   CHOLHDL 3.7 04/08/2016 0330   VLDL 24 04/08/2016 0330   LDLCALC 91 04/08/2016 0330   HgbA1c: No results found for: HGBA1C Urine Drug Screen: No results found for: LABOPIA, COCAINSCRNUR, LABBENZ, AMPHETMU, THCU, LABBARB    IMAGING  Ct Angio Head W Or Wo Contrast Ct Angio Neck W And/or Wo Contrast 04/07/2016 1. 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 2. Moderately age advanced cerebral atrophy. 3. No intracranial arterial occlusion or high-grade stenosis. No acute intracranial abnormality. 4. Ground-glass opacities within the right upper lobe, concerning for infection.   Ct Chest Wo Contrast 04/08/2016 1. Mild ground-glass densities within the subpleural and posterior right upper lobe. Findings are nonspecific but could be due to respiratory infection or nonspecific foci of pneumonitis. Lung fields are otherwise clear. 2. Mildly prominent residual thymus tissue without masslike features.  cally Signed   By: Edwin Roberson  Edwin Roberson M.D.   On: 04/08/2016 00:46   Mr Brain Wo Contrast 04/08/2016 1.  Two subcentimeter foci of acute infarction in the right frontal and parietal cortex. 2. 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     PHYSICAL EXAM Pleasant young Caucasian male in mild distress due to headache. . Afebrile. Head is nontraumatic. Neck is supple without bruit.    Cardiac exam no murmur or gallop. Lungs are clear to auscultation. Distal pulses are  well felt. Neurological Exam ;  Awake  Alert oriented x 3. Normal speech and language.eye movements full without nystagmus.fundi were not visualized. Vision acuity and fields appear normal. Hearing is normal. Palatal movements are normal. Face symmetric. Tongue midline. Normal strength, tone, reflexes and coordination. Normal sensation. Gait deferred.  ASSESSMENT/PLAN [name] is a 35 y.o. male with history of hypertension, migraine headaches and anxiety presenting with neck pain, HA and recurrent LUE weakness/numbness (twice since Sun . He did not receive IV t-PA due to rapidly resolving deficits.   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 possibly related to presence of prolonged migraine vs related to carotid disease  Resultant  HA, L hand weakness/numbness resolved  CTA head and neck R 80-90% and L 70% stenosis. No intracranial high grade stenosis. RUL ground glass opacities.  MRI  2 tiny R frontal and R parietal infarcts  Carotid Doppler  pending   2D Echo  pending   LDL 91  HgbA1c pending  SCDs for VTE prophylaxis Diet Heart Room service appropriate? Yes; Fluid consistency: Thin  No antithrombotic prior to admission, now on aspirin 325 mg daily. Continue at discharge  Patient counseled to be compliant with his antithrombotic medications  Ongoing aggressive stroke risk factor management  Therapy recommendations:  No therapy needs identified  Disposition:  pending   Carotid Disease  CTA with significant B ICA disease, not typically seen in a patient of this age  Carotid US to confirm findings  Consider VVS consult pending findings  Hypertension  On coreg, losartan and HCTZ continued in hospital  Stable  Permissive hypertension (OK if < 220/120) but gradually normalize in 5-7 days  Long-term BP goal normotensive  Hyperlipidemia  Home meds:  No statin  LDL 91, goal < 70  Added lipitor 40 mg daily  Continue statin at  discharge  Other Stroke Risk Factors  Obesity, Body mass index is 30.57 kg/m., recommend weight loss, diet and exercise as appropriate   Family hx of heart disease, starting at young age  Patient stress due to father's current hospitalization (CAD, in ICU)   Migraines  Treated by PCP  Unable to tolerate preventive meds  Takes ibuprofen at home  Add depacon 1 gm now followed by depakote 500 ER daily  relpax bid prn   Other Active Problems  Anxiety, ? ADHD, on Adderall PTA, o hold in hospital   Hospital day # 0  Rhoderick MoodyBIBY,SHARON  [facility] See Amion for Pager information 04/08/2016 10:41 AM  I have personally examined this patient, reviewed notes, independently viewed imaging studies, participated in medical decision making and plan of care.ROS completed by me personally and pertinent positives fully documented  I have made any additions or clarifications directly to the above note. Agree with note above. He presented with 3 days of unrelenting headache which is similar to his known history of migraines with transient left hand weakness and numbness. Clinical history suggests prolonged migraine but MRI scan does show 2 tiny punctate right frontal and parietal infarcts and CT angiogram of the neck suggest  high-grade proximal carotid stenosis. Patient's young age and relative absence of risk factors makes this unusual hence we will check carotid ultrasound to confirm proximal right ICA stenosis and if confirmed he will need vascular surgery consultation for elective carotid revascularization. Treat his migraine headaches with IV Depacon followed by Depakote ER and Relpax for symptomatic relief. Discussion with patient, wife and family members at the bedside and answered questions. Greater than 50% time during this 35 minutes visit spent on counseling and coordination of care about migraine, stroke, carotid stenosis and answering questions  Delia Heady, MD Medical  Director [facility] Pager: [phone] 04/08/2016 1:23 PM  To contact Stroke Continuity provider, please refer to [email or web address]. After hours, contact General Neurology  ADDENDUM. Carotid ultrasound results reveal confirmed 80-99% right ICA and 60-79% left ICA stenosis. Vascular surgery consult called. He likely needs elective right carotid surgery in 1-2 weeks. Discussed with Dr. Lennox Laity, MD

## 2016-04-08 NOTE — Care Management Note (Signed)
Case Management Note  Patient Details  Name: Edwin ElbeChristopher Goodlin MRN: [id number] Date of Birth: 03/07/1981  Subjective/Objective:      Pt admitted with CVA. He is from home.               Action/Plan: Awaiting PT/OT recommendations. CM following for discharge needs.   Expected Discharge Date:                  Expected Discharge Plan:     In-House Referral:     Discharge planning Services     Post Acute Care Choice:    Choice offered to:     DME Arranged:    DME Agency:     HH Arranged:    HH Agency:     Status of Service:  In process, will continue to follow  If discussed at Long Length of Stay Meetings, dates discussed:    Additional Comments:  Kermit BaloKelli F Cyera Balboni, RN 04/08/2016, 12:28 PM

## 2016-04-08 NOTE — H&P (Signed)
History and Physical    Edwin Roberson ZOX:[id number] DOB: [birth year] DOA: 04/07/2016  PCP: Dois Davenport., MD   Patient coming from: Urgent care clinic  Chief Complaint: Head and neck pain, near syncope, atypical headaches, numbness in left hand  HPI: Edwin Roberson is a 35 y.o. gentleman with a history of HTN, anxiety, and migraine headaches who has had atypical headaches, intermittent left hand numbness, neck pain, and near syncope since Sunday.  No facial droop.  No slurred speech.  No focal weakness.  No loss of consciousness.    He has had nausea and nonbloody emesis.  He has taken ibuprofen as needed for headache.  ED Course: He presented to Urgent Care, where he was referred for CTA of the head and neck.  Findings included: 1. 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 2. Moderately age advanced cerebral atrophy. 3. No intracranial arterial occlusion or high-grade stenosis. No acute intracranial abnormality. 4. Ground-glass opacities within the right upper lobe, concerning for infection. Further characterization with dedicated chest radiography or chest CT without contrast is recommended.  Hospitalist asked to admit.  Neurology consulted.  Review of Systems: As per HPI otherwise 10 point review of systems negative.    Past Medical History:  Diagnosis Date  . Anxiety   . Hypertension   . Migraine   . PCL injury     Past Surgical History:  Procedure Laterality Date  . SHOULDER SURGERY Right 03/2014    SOCIAL HISTORY: Stopped smoking 8-10 years ago.  Rare EtOH use.  No illicit drug use.  He is married. He has two children.  Allergies  Allergen Reactions  . Tramadol Other (See Comments)    Pt felt out of it    Family History  Problem Relation Age of Onset  . Heart disease Father   Father had premature onset heart disease in his  38's. Maternal grandfather had elevated cholesterol.   Prior to Admission medications   Medication Sig Start Date End Date Taking? Authorizing Provider  amphetamine-dextroamphetamine (ADDERALL XR) 30 MG 24 hr capsule Take 30 mg by mouth daily.   Yes Historical Provider, MD  carvedilol (COREG) 6.25 MG tablet Take 6.25 mg by mouth 2 (two) times daily with a meal.   Yes Historical Provider, MD  ibuprofen (ADVIL,MOTRIN) 600 MG tablet Take 1 tablet (600 mg total) by mouth every 8 (eight) hours as needed. Patient taking differently: Take 600 mg by mouth every 8 (eight) hours as needed for moderate pain.  06/12/15  Yes Ofilia Neas, PA-C  losartan-hydrochlorothiazide (HYZAAR) 100-25 MG per tablet Take 1 tablet by mouth daily. Reported on 06/12/2015   Yes Historical Provider, MD  naproxen sodium (ANAPROX) 220 MG tablet Take 440 mg by mouth 2 (two) times daily as needed (pain).   Yes Historical Provider, MD    Physical Exam: Vitals:   04/07/16 2229 04/07/16 2330 04/07/16 2345 04/08/16 0000  BP: 126/76 118/77 119/74 127/85  Pulse: 83 74 77 81  Resp: 16     Temp:      TempSrc:      SpO2: 98% 96% 96% 97%  Weight:      Height:          Constitutional: NAD, calm, comfortable Vitals:   04/07/16 2229 04/07/16 2330 04/07/16 2345 04/08/16 0000  BP: 126/76 118/77 119/74 127/85  Pulse: 83 74 77 81  Resp: 16     Temp:  TempSrc:      SpO2: 98% 96% 96% 97%  Weight:      Height:       Eyes: PERRL, lids and conjunctivae normal ENMT: Mucous membranes are moist. Posterior pharynx clear of any exudate or lesions. Normal dentition.  Neck: normal appearance, supple Respiratory: clear to auscultation bilaterally, no wheezing, no crackles. Normal respiratory effort. No accessory muscle use.  Cardiovascular: Normal rate, regular rhythm, no murmurs / rubs / gallops. No extremity edema. 2+ pedal pulses. I did not hear a bruit. GI: abdomen is soft and compressible.  No distention.  No tenderness.  No  masses palpated.  Bowel sounds are present. Musculoskeletal:  No joint deformity in upper and lower extremities. Good ROM, no contractures. Normal muscle tone.  Skin: no rashes, warm and dry Neurologic: CN 2-12 grossly intact. Sensation intact, Strength symmetric bilaterally, 5/5  Psychiatric: Normal judgment and insight. Alert and oriented x 3. Normal mood.     Labs on Admission: I have personally reviewed following labs and imaging studies  CBC:  Recent Labs Lab 04/07/16 2005  WBC 12.2*  NEUTROABS 6.3  HGB 15.0  HCT 42.5  MCV 85.0  PLT 272   Basic Metabolic Panel:  Recent Labs Lab 04/07/16 2005  NA 139  K 3.9  CL 103  CO2 26  GLUCOSE 91  BUN 11  CREATININE 1.15  CALCIUM 9.1   GFR: Estimated Creatinine Clearance: 90.8 mL/min (by C-G formula based on SCr of 1.15 mg/dL). Liver Function Tests:  Recent Labs Lab 04/07/16 2005  AST 15  ALT 14*  ALKPHOS 40  BILITOT 0.8  PROT 6.7  ALBUMIN 4.0   Urine analysis:    Component Value Date/Time   COLORURINE YELLOW 04/07/2016 2011   APPEARANCEUR CLEAR 04/07/2016 2011   LABSPEC 1.028 04/07/2016 2011   PHURINE 6.5 04/07/2016 2011   GLUCOSEU NEGATIVE 04/07/2016 2011   HGBUR NEGATIVE 04/07/2016 2011   BILIRUBINUR NEGATIVE 04/07/2016 2011   KETONESUR NEGATIVE 04/07/2016 2011   PROTEINUR NEGATIVE 04/07/2016 2011   NITRITE NEGATIVE 04/07/2016 2011   LEUKOCYTESUR NEGATIVE 04/07/2016 2011   Radiological Exams on Admission: Ct Angio Head W Or Wo Contrast  Result Date: 04/07/2016 CLINICAL DATA:  Right-sided neck pain and headache. Nausea and vomiting. Migraines. EXAM: CT ANGIOGRAPHY HEAD AND NECK TECHNIQUE: Multidetector CT imaging of the head and neck was performed using the standard protocol during bolus administration of intravenous contrast. Multiplanar CT image reconstructions and MIPs were obtained to evaluate the vascular anatomy. Carotid stenosis measurements (when applicable) are obtained utilizing NASCET  criteria, using the distal internal carotid diameter as the denominator. CONTRAST:  50 mL Isovue 370 IV COMPARISON:  None. FINDINGS: CT HEAD FINDINGS Brain: No mass lesion, intraparenchymal hemorrhage or extra-axial collection. No evidence of acute cortical infarct. There is atrophy of the brain greater than expected for age. Vascular: No hyperdense vessel or unexpected calcification. Skull: Normal visualized skull base, calvarium and extracranial soft tissues. Sinuses/Orbits: Left maxillary retention cysts. Normal orbits. Review of the MIP images confirms the above findings CTA NECK FINDINGS Aortic arch: There is a normal variant aortic arch branching pattern with the brachiocephalic and left common carotid arteries sharing a common origin. Proximal subclavian arteries are normal. No aneurysm or dissection. Right carotid system: There is eccentric plaque within the proximal left internal carotid artery resulting in qualitatively near critical stenosis. By NASCET criteria, the stenosis measures 85%. The remainder of the right internal carotid artery is patent and of normal caliber. Left carotid system: There  is mostly noncalcified plaque at the left ICA origin resulting in approximately 70% stenosis. Remainder of the left internal carotid artery is normal. Vertebral arteries: Both vertebral artery origins are widely patent. The vertebral system is codominant. Both vertebral arteries are normal to their confluence with the basilar artery. Skeleton: Normal Other neck: The nasopharynx is clear. The oropharynx and hypopharynx are normal. The epiglottis is normal. The supraglottic larynx, glottis and subglottic larynx are normal. No retropharyngeal collection. The parapharyngeal spaces are preserved. The parotid and submandibular glands are normal. No sialolithiasis or salivary ductal dilatation. The thyroid gland is normal. There is no cervical lymphadenopathy. Upper chest: There incompletely visualized ground-glass  opacities within the posterior segment of the right upper lobe. Review of the MIP images confirms the above findings CTA HEAD FINDINGS Anterior circulation: --Intracranial internal carotid arteries: Normal. --Anterior cerebral arteries: Normal. --Middle cerebral arteries: Normal. --Posterior communicating arteries: Absent bilaterally. Posterior circulation: --Posterior cerebral arteries: Normal. --Superior cerebellar arteries: Normal. --Basilar artery: Normal. --Anterior inferior cerebellar arteries: Normal. --Posterior inferior cerebellar arteries: Normal. Venous sinuses: As permitted by contrast timing, patent. Anatomic variants: None Delayed phase: No abnormal intracranial enhancement. Review of the MIP images confirms the above findings IMPRESSION: 1. 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 2. Moderately age advanced cerebral atrophy. 3. No intracranial arterial occlusion or high-grade stenosis. No acute intracranial abnormality. 4. Ground-glass opacities within the right upper lobe, concerning for infection. Further characterization with dedicated chest radiography or chest CT without contrast is recommended. These results were called by telephone at the time of interpretation on 04/07/2016 at 11:51 pm to Dr. Lynelle Doctor, who verbally acknowledged these results. Electronically Signed   By: Deatra Robinson M.D.   On: 04/07/2016 23:53   Ct Angio Neck W And/or Wo Contrast  Result Date: 04/07/2016 CLINICAL DATA:  Right-sided neck pain and headache. Nausea and vomiting. Migraines. EXAM: CT ANGIOGRAPHY HEAD AND NECK TECHNIQUE: Multidetector CT imaging of the head and neck was performed using the standard protocol during bolus administration of intravenous contrast. Multiplanar CT image reconstructions and MIPs were obtained to evaluate the vascular anatomy. Carotid stenosis measurements (when applicable) are  obtained utilizing NASCET criteria, using the distal internal carotid diameter as the denominator. CONTRAST:  50 mL Isovue 370 IV COMPARISON:  None. FINDINGS: CT HEAD FINDINGS Brain: No mass lesion, intraparenchymal hemorrhage or extra-axial collection. No evidence of acute cortical infarct. There is atrophy of the brain greater than expected for age. Vascular: No hyperdense vessel or unexpected calcification. Skull: Normal visualized skull base, calvarium and extracranial soft tissues. Sinuses/Orbits: Left maxillary retention cysts. Normal orbits. Review of the MIP images confirms the above findings CTA NECK FINDINGS Aortic arch: There is a normal variant aortic arch branching pattern with the brachiocephalic and left common carotid arteries sharing a common origin. Proximal subclavian arteries are normal. No aneurysm or dissection. Right carotid system: There is eccentric plaque within the proximal left internal carotid artery resulting in qualitatively near critical stenosis. By NASCET criteria, the stenosis measures 85%. The remainder of the right internal carotid artery is patent and of normal caliber. Left carotid system: There is mostly noncalcified plaque at the left ICA origin resulting in approximately 70% stenosis. Remainder of the left internal carotid artery is normal. Vertebral arteries: Both vertebral artery origins are widely patent. The vertebral system is codominant. Both vertebral arteries are normal to their confluence with the basilar artery. Skeleton: Normal Other neck:  The nasopharynx is clear. The oropharynx and hypopharynx are normal. The epiglottis is normal. The supraglottic larynx, glottis and subglottic larynx are normal. No retropharyngeal collection. The parapharyngeal spaces are preserved. The parotid and submandibular glands are normal. No sialolithiasis or salivary ductal dilatation. The thyroid gland is normal. There is no cervical lymphadenopathy. Upper chest: There incompletely  visualized ground-glass opacities within the posterior segment of the right upper lobe. Review of the MIP images confirms the above findings CTA HEAD FINDINGS Anterior circulation: --Intracranial internal carotid arteries: Normal. --Anterior cerebral arteries: Normal. --Middle cerebral arteries: Normal. --Posterior communicating arteries: Absent bilaterally. Posterior circulation: --Posterior cerebral arteries: Normal. --Superior cerebellar arteries: Normal. --Basilar artery: Normal. --Anterior inferior cerebellar arteries: Normal. --Posterior inferior cerebellar arteries: Normal. Venous sinuses: As permitted by contrast timing, patent. Anatomic variants: None Delayed phase: No abnormal intracranial enhancement. Review of the MIP images confirms the above findings IMPRESSION: 1. 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 2. Moderately age advanced cerebral atrophy. 3. No intracranial arterial occlusion or high-grade stenosis. No acute intracranial abnormality. 4. Ground-glass opacities within the right upper lobe, concerning for infection. Further characterization with dedicated chest radiography or chest CT without contrast is recommended. These results were called by telephone at the time of interpretation on 04/07/2016 at 11:51 pm to Dr. Lynelle DoctorKnapp, who verbally acknowledged these results. Electronically Signed   By: Deatra RobinsonKevin  Herman M.D.   On: 04/07/2016 23:53   Ct Chest Wo Contrast  Result Date: 04/08/2016 CLINICAL DATA:  Abnormal upper lungs on CTA of the neck EXAM: CT CHEST WITHOUT CONTRAST TECHNIQUE: Multidetector CT imaging of the chest was performed following the standard protocol without IV contrast. COMPARISON:  04/07/2016 FINDINGS: Cardiovascular: Limited without intravenous contrast. The aorta is non aneurysmal. Heart size is nonenlarged. No pericardial effusion. Mediastinum/Nodes:  Triangular-shaped soft tissue density in the anterior mediastinum, likely reflects residual thymic tissue, slightly prominent for age but without masslike features. Thyroid gland is unremarkable. Trachea and mainstem bronchi appear within normal limits. Sub cm nonspecific mediastinal lymph nodes. Limited assessment for hilar nodes without contrast. No axillary adenopathy. Esophagus is within normal limits. Lungs/Pleura: No pleural effusion. Mild ground-glass densities are present within the subpleural and posterior right upper lobe. Lung fields are otherwise clear. No effusion. Upper Abdomen: Residual contrast within the renal collecting systems. No acute abnormalities. Musculoskeletal: No chest wall mass or suspicious bone lesions identified. IMPRESSION: 1. Mild ground-glass densities within the subpleural and posterior right upper lobe. Findings are nonspecific but could be due to respiratory infection or nonspecific foci of pneumonitis. Lung fields are otherwise clear. 2. Mildly prominent residual thymus tissue without masslike features. Electronically Signed   By: Jasmine PangKim  Fujinaga M.D.   On: 04/08/2016 00:46    Assessment/Plan Principal Problem:   Carotid artery disease (HCC) Active Problems:   HTN (hypertension)   TIA (transient ischemic attack)   Anxiety   Migraine headache   Numbness and tingling in left hand   Near syncope      Probable TIA with bilateral carotid disease as seen on CTA neck --Neurology consult appreciated --MRI brain, echo, carotid ultrasounds ordered --Hypercoag panel, lipid panel, A1c ordered --Full strength aspirin now --IR consult deferred to AM attending  HTN --Continue Coreg, losartan, HCTZ --HOLD NSAIDs for now  Anxiety ?ADHD --HOLD Adderall while inpatient   DVT prophylaxis: SCDs Code Status: FULL Family Communication: Patient's wife present in the ED at time of admission. Disposition Plan:  Expect he will go home at discharge. Consults called:  Neurology Admission status: Place in observation with telemetry monitoring.   TIME SPENT: 60 minutes   Jerene Bears MD [facility] Pager [phone]  If 7PM-7AM, please contact night-coverage [email or web address] Password [email or web address]  04/08/2016, 1:31 AM

## 2016-04-08 NOTE — Evaluation (Signed)
Speech Language Pathology Evaluation Patient Details Name: Edwin Roberson MRN: [id number] DOB: 1981/03/23 Today's Date: 04/08/2016 Time: [id number] SLP Time Calculation (min) (ACUTE ONLY): 31 min  Problem List:  Patient Active Problem List   Diagnosis Date Noted  . Carotid artery disease (HCC) 04/08/2016  . TIA (transient ischemic attack) 04/08/2016  . Anxiety 04/08/2016  . Migraine headache 04/08/2016  . Numbness and tingling in left hand 04/08/2016  . Near syncope 04/08/2016  . Acute CVA (cerebrovascular accident) (HCC) 04/08/2016  . HTN (hypertension) 07/11/2012   Past Medical History:  Past Medical History:  Diagnosis Date  . Anxiety   . Hypertension   . Migraine   . PCL injury    Past Surgical History:  Past Surgical History:  Procedure Laterality Date  . SHOULDER SURGERY Right 03/2014   HPI:  35 y.o.malehistory of hypertension, migraine headaches and anxiety presenting with recurrent episodes of weakness involving his left upper extremity, particularly his left hand, as well as right-sided headache and pain involving right side of his neck. MR showed acute infarction in the right frontal and parietal cortex.   Assessment / Plan / Recommendation Clinical Impression  Pt scored a 26 on the MoCA, which is considered within normal cognitive limits. Wife present reports pt has h/o a poor memory and thus explains the Min cueing needed during delayed recall task. Pt reports no acute cognitive difficulties and stated that initial s/s have resolved. SLP educated pt on safety and precautions upon discharge. His wife was supportive and in agreement with these recommendation (i.e. help with finances, creating reminder lists). Recommend no additional ST as pt appears to be at baseline functioning.     SLP Assessment  Patient needs continued Speech Lanaguage Pathology Services    Follow Up Recommendations  None    Frequency and Duration           SLP  Evaluation Cognition  Overall Cognitive Status: Within Functional Limits for tasks assessed       Comprehension  Auditory Comprehension Overall Auditory Comprehension: Appears within functional limits for tasks assessed Visual Recognition/Discrimination Discrimination: Not tested Reading Comprehension Reading Status: Not tested    Expression Expression Primary Mode of Expression: Verbal Verbal Expression Overall Verbal Expression: Appears within functional limits for tasks assessed Written Expression Written Expression: Not tested   Oral / Motor  Oral Motor/Sensory Function Overall Oral Motor/Sensory Function: Within functional limits Motor Speech Overall Motor Speech: Appears within functional limits for tasks assessed   GO          Functional Assessment Tool Used: skilled clinical judgment Functional Limitations: Memory Memory Current Status ([id number](G9168): At least 1 percent but less than 20 percent impaired, limited or restricted Memory Goal Status ([id number](G9169): At least 1 percent but less than 20 percent impaired, limited or restricted Memory Discharge Status [phone](G9170): At least 1 percent but less than 20 percent impaired, limited or restricted         Tollie EthHaleigh Ragan Jeanni Allshouse, Student SLP  Caryl NeverHaleigh R Vaanya Shambaugh 04/08/2016, 2:03 PM

## 2016-04-08 NOTE — Progress Notes (Signed)
[facility] patient still complaining of head ache and seeing stars also some nausea, vitals signs are normal blood sugar is 171 and he has no neurological deficits. I gave him Tylenol and Zofran and [facility] [address] I gave the nurse report on him, he will be transporting with the current IV in place per the request of the [facility].

## 2016-04-08 NOTE — Consult Note (Signed)
Consult requested by Dr Pearlean BrownieSethi from Neurology regarding symptomatic carotid stenosis.  [facility]  Will defer consult  Fabienne Brunsharles Courteny Egler, MD [facility] Office: [phone] Pager: [phone]

## 2016-04-08 NOTE — Progress Notes (Signed)
Pt admitted from ED with stroke like symptoms accompanied by family, alert and oriented, c/o of slight headache, settled in bed with call light at bedside, v/s stable, will continue to monitor. Obasogie-Asidi, Arden Axon Efe

## 2016-04-08 NOTE — ED Provider Notes (Signed)
[facility]-EMERGENCY DEPT Provider Note   CSN: [id number] Arrival date & time: 04/07/16  1941  Time seen 23:47 PM   History   Chief Complaint Chief Complaint  Patient presents with  . Headache    hand numbness    HPI Edwin Roberson is a 35 y.o. male.  HPI  patient reports November 25 he had some pain in the right side of his neck. He denies any pain on swallowing and states when he put his hand on his neck made him feel like he was going to pass out. The following day while at church she felt lightheaded and dizzy like he was going to pass out and then only happened that one time. He states since then he has noted he has having some numbness of his left hand from the wrist distally that last about 5 minutes. He states he's had about 3 or 4 episodes of that since it started. The last episode was about 6:15 PM tonight. He has noted since this started that when he types he has to look at the keyboard when he types of his left hand or he types the wrong letter. Patient states he is left-handed. Patient also states he has a history of migraines when he was in high school and has had a few since he was an adult. Normally these are very bad with photophobia and phonophobia and get better when he sleeps. He describes a headache and points to an area vertically on the right side of his scalp that he describes as sharp with some nausea. He states the headache hurts worse if he sneezes or coughs. He also states when he looks to the left things look blurred. He states this is all different from his migraines. He states he has never had a cholesterol test done. He also reports however his father is currently in the hospital getting bypass surgery for coronary artery disease. His father however has normal cholesterol.  He denies chest pain but has had some shortness of breath  PCP Dr. Hal Hopeichter  Past Medical History:  Diagnosis Date  . Anxiety   . Hypertension   . Migraine   . PCL injury     Patient  Active Problem List   Diagnosis Date Noted  . HTN (hypertension) 07/11/2012    Past Surgical History:  Procedure Laterality Date  . SHOULDER SURGERY Right 03/2014       Home Medications    Prior to Admission medications   Medication Sig Start Date End Date Taking? Authorizing Provider  amphetamine-dextroamphetamine (ADDERALL XR) 30 MG 24 hr capsule Take 30 mg by mouth daily.   Yes Historical Provider, MD  carvedilol (COREG) 6.25 MG tablet Take 6.25 mg by mouth 2 (two) times daily with a meal.   Yes Historical Provider, MD  ibuprofen (ADVIL,MOTRIN) 600 MG tablet Take 1 tablet (600 mg total) by mouth every 8 (eight) hours as needed. Patient taking differently: Take 600 mg by mouth every 8 (eight) hours as needed for moderate pain.  06/12/15  Yes Ofilia NeasMichael L Clark, PA-C  losartan-hydrochlorothiazide (HYZAAR) 100-25 MG per tablet Take 1 tablet by mouth daily. Reported on 06/12/2015   Yes Historical Provider, MD  naproxen sodium (ANAPROX) 220 MG tablet Take 440 mg by mouth 2 (two) times daily as needed (pain).   Yes Historical Provider, MD    Family History Family History  Problem Relation Age of Onset  . Heart disease Father     Social History Social History  Substance Use  Topics  . Smoking status: Never Smoker  . Smokeless tobacco: Never Used  . Alcohol use No     Comment: occ  Employed   Allergies   Tramadol   Review of Systems Review of Systems  All other systems reviewed and are negative.    Physical Exam Updated Vital Signs BP 127/85   Pulse 81   Temp 97.7 F (36.5 C) (Oral)   Resp 16   Ht 5\' 6"  (1.676 m)   Wt 180 lb (81.6 kg)   SpO2 97%   BMI 29.05 kg/m   Vital signs normal    Physical Exam  Constitutional: He is oriented to person, place, and time. He appears well-developed and well-nourished.  Non-toxic appearance. He does not appear ill. No distress.  HENT:  Head: Normocephalic and atraumatic.  Right Ear: External ear normal.  Left Ear: External  ear normal.  Nose: Nose normal. No mucosal edema or rhinorrhea.  Mouth/Throat: Oropharynx is clear and moist and mucous membranes are normal. No dental abscesses or uvula swelling.  Eyes: Conjunctivae and EOM are normal. Pupils are equal, round, and reactive to light.  Neck: Normal range of motion and full passive range of motion without pain. Neck supple.  Patient has a faint bruit on the right  Cardiovascular: Normal rate, regular rhythm and normal heart sounds.  Exam reveals no gallop and no friction rub.   No murmur heard. Pulmonary/Chest: Effort normal and breath sounds normal. No respiratory distress. He has no wheezes. He has no rhonchi. He has no rales. He exhibits no tenderness and no crepitus.  Abdominal: Soft. Normal appearance and bowel sounds are normal. He exhibits no distension. There is no tenderness. There is no rebound and no guarding.  Musculoskeletal: Normal range of motion. He exhibits no edema or tenderness.  Moves all extremities well.   Neurological: He is alert and oriented to person, place, and time. He has normal strength. No cranial nerve deficit.  No facial asymmetry, when I do visual fields they are intact. He has no grip weakness.  Skin: Skin is warm, dry and intact. No rash noted. No erythema. No pallor.  Psychiatric: He has a normal mood and affect. His speech is normal and behavior is normal. His mood appears not anxious.  Nursing note and vitals reviewed.    ED Treatments / Results  Labs (all labs ordered are listed, but only abnormal results are displayed) Results for orders placed or performed during the hospital encounter of 04/07/16  CBC with Differential  Result Value Ref Range   WBC 12.2 (H) 4.0 - 10.5 K/uL   RBC 5.00 4.22 - 5.81 MIL/uL   Hemoglobin 15.0 13.0 - 17.0 g/dL   HCT 16.1 09.6 - 04.5 %   MCV 85.0 78.0 - 100.0 fL   MCH 30.0 26.0 - 34.0 pg   MCHC 35.3 30.0 - 36.0 g/dL   RDW 40.9 81.1 - 91.4 %   Platelets 272 150 - 400 K/uL    Neutrophils Relative % 51 %   Neutro Abs 6.3 1.7 - 7.7 K/uL   Lymphocytes Relative 35 %   Lymphs Abs 4.2 (H) 0.7 - 4.0 K/uL   Monocytes Relative 8 %   Monocytes Absolute 1.0 0.1 - 1.0 K/uL   Eosinophils Relative 5 %   Eosinophils Absolute 0.6 0.0 - 0.7 K/uL   Basophils Relative 1 %   Basophils Absolute 0.1 0.0 - 0.1 K/uL  Comprehensive metabolic panel  Result Value Ref Range   Sodium 139  135 - 145 mmol/L   Potassium 3.9 3.5 - 5.1 mmol/L   Chloride 103 101 - 111 mmol/L   CO2 26 22 - 32 mmol/L   Glucose, Bld 91 65 - 99 mg/dL   BUN 11 6 - 20 mg/dL   Creatinine, Ser 2.13 0.61 - 1.24 mg/dL   Calcium 9.1 8.9 - 08.6 mg/dL   Total Protein 6.7 6.5 - 8.1 g/dL   Albumin 4.0 3.5 - 5.0 g/dL   AST 15 15 - 41 U/L   ALT 14 (L) 17 - 63 U/L   Alkaline Phosphatase 40 38 - 126 U/L   Total Bilirubin 0.8 0.3 - 1.2 mg/dL   GFR calc non Af Amer >60 >60 mL/min   GFR calc Af Amer >60 >60 mL/min   Anion gap 10 5 - 15  Urinalysis, Routine w reflex microscopic (not at [facility])  Result Value Ref Range   Color, Urine YELLOW YELLOW   APPearance CLEAR CLEAR   Specific Gravity, Urine 1.028 1.005 - 1.030   pH 6.5 5.0 - 8.0   Glucose, UA NEGATIVE NEGATIVE mg/dL   Hgb urine dipstick NEGATIVE NEGATIVE   Bilirubin Urine NEGATIVE NEGATIVE   Ketones, ur NEGATIVE NEGATIVE mg/dL   Protein, ur NEGATIVE NEGATIVE mg/dL   Nitrite NEGATIVE NEGATIVE   Leukocytes, UA NEGATIVE NEGATIVE   Laboratory interpretation all normal except Leukocytosis    EKG  EKG Interpretation None       Radiology Ct Angio Head W Or Wo Contrast Ct Angio Neck W And/or Wo Contrast  Result Date: 04/07/2016 CLINICAL DATA:  Right-sided neck pain and headache. Nausea and vomiting. Migraines. EXAM: CT ANGIOGRAPHY HEAD AND NECK TECHNIQUE: Multidetector CT imaging of the head and neck was performed using the standard protocol during bolus administration of intravenous contrast. Multiplanar CT image reconstructions and MIPs were obtained to  evaluate the vascular anatomy. Carotid stenosis measurements (when applicable) are obtained utilizing NASCET criteria, using the distal internal carotid diameter as the denominator. CONTRAST:  50 mL Isovue 370 IV COMPARISON:  None. FINDINGS: CT HEAD FINDINGS Brain: No mass lesion, intraparenchymal hemorrhage or extra-axial collection. No evidence of acute cortical infarct. There is atrophy of the brain greater than expected for age. Vascular: No hyperdense vessel or unexpected calcification. Skull: Normal visualized skull base, calvarium and extracranial soft tissues. Sinuses/Orbits: Left maxillary retention cysts. Normal orbits. Review of the MIP images confirms the above findings CTA NECK FINDINGS Aortic arch: There is a normal variant aortic arch branching pattern with the brachiocephalic and left common carotid arteries sharing a common origin. Proximal subclavian arteries are normal. No aneurysm or dissection. Right carotid system: There is eccentric plaque within the proximal left internal carotid artery resulting in qualitatively near critical stenosis. By NASCET criteria, the stenosis measures 85%. The remainder of the right internal carotid artery is patent and of normal caliber. Left carotid system: There is mostly noncalcified plaque at the left ICA origin resulting in approximately 70% stenosis. Remainder of the left internal carotid artery is normal. Vertebral arteries: Both vertebral artery origins are widely patent. The vertebral system is codominant. Both vertebral arteries are normal to their confluence with the basilar artery. Skeleton: Normal Other neck: The nasopharynx is clear. The oropharynx and hypopharynx are normal. The epiglottis is normal. The supraglottic larynx, glottis and subglottic larynx are normal. No retropharyngeal collection. The parapharyngeal spaces are preserved. The parotid and submandibular glands are normal. No sialolithiasis or salivary ductal dilatation. The thyroid gland  is normal. There is no cervical  lymphadenopathy. Upper chest: There incompletely visualized ground-glass opacities within the posterior segment of the right upper lobe. Review of the MIP images confirms the above findings CTA HEAD FINDINGS Anterior circulation: --Intracranial internal carotid arteries: Normal. --Anterior cerebral arteries: Normal. --Middle cerebral arteries: Normal. --Posterior communicating arteries: Absent bilaterally. Posterior circulation: --Posterior cerebral arteries: Normal. --Superior cerebellar arteries: Normal. --Basilar artery: Normal. --Anterior inferior cerebellar arteries: Normal. --Posterior inferior cerebellar arteries: Normal. Venous sinuses: As permitted by contrast timing, patent. Anatomic variants: None Delayed phase: No abnormal intracranial enhancement. Review of the MIP images confirms the above findings IMPRESSION: 1. 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 2. Moderately age advanced cerebral atrophy. 3. No intracranial arterial occlusion or high-grade stenosis. No acute intracranial abnormality. 4. Ground-glass opacities within the right upper lobe, concerning for infection. Further characterization with dedicated chest radiography or chest CT without contrast is recommended. These results were called by telephone at the time of interpretation on 04/07/2016 at 11:51 pm to Dr. Lynelle Doctor, who verbally acknowledged these results. Electronically Signed   By: Deatra Robinson M.D.   On: 04/07/2016 23:53   Ct Chest Wo Contrast  Result Date: 04/08/2016 CLINICAL DATA:  Abnormal upper lungs on CTA of the neck EXAM: CT CHEST WITHOUT CONTRAST TECHNIQUE: Multidetector CT imaging of the chest was performed following the standard protocol without IV contrast. COMPARISON:  04/07/2016 FINDINGS: Cardiovascular: Limited without intravenous contrast. The aorta is non aneurysmal.  Heart size is nonenlarged. No pericardial effusion. Mediastinum/Nodes: Triangular-shaped soft tissue density in the anterior mediastinum, likely reflects residual thymic tissue, slightly prominent for age but without masslike features. Thyroid gland is unremarkable. Trachea and mainstem bronchi appear within normal limits. Sub cm nonspecific mediastinal lymph nodes. Limited assessment for hilar nodes without contrast. No axillary adenopathy. Esophagus is within normal limits. Lungs/Pleura: No pleural effusion. Mild ground-glass densities are present within the subpleural and posterior right upper lobe. Lung fields are otherwise clear. No effusion. Upper Abdomen: Residual contrast within the renal collecting systems. No acute abnormalities. Musculoskeletal: No chest wall mass or suspicious bone lesions identified. IMPRESSION: 1. Mild ground-glass densities within the subpleural and posterior right upper lobe. Findings are nonspecific but could be due to respiratory infection or nonspecific foci of pneumonitis. Lung fields are otherwise clear. 2. Mildly prominent residual thymus tissue without masslike features. Electronically Signed   By: Jasmine Pang M.D.   On: 04/08/2016 00:46    Procedures Procedures (including critical care time)  Medications Ordered in ED Medications  iopamidol (ISOVUE-370) 76 % injection (not administered)  iopamidol (ISOVUE-370) 76 % injection (50 mLs  Contrast Given 04/07/16 2246)     Initial Impression / Assessment and Plan / ED Course  I have reviewed the triage vital signs and the nursing notes.  Pertinent labs & imaging results that were available during my care of the patient were reviewed by me and considered in my medical decision making (see chart for details).  Clinical Course    I discussed patient's test results with him including the  CTA of his carotids and head. We discussed need for admission and he is agreeable.  12:22 AM Dr. Roseanne Reno, neurologist will  see patient as consult but wants hospitalist to admit.  12:41 AM Dr. Michael Litter, hospitalist wants patient admitted to observation telemetry.  Final Clinical Impressions(s) / ED Diagnoses   Final diagnoses:  Transient cerebral ischemia, unspecified type   Plan admission  Devoria Albe, MD,  Concha PyoFACEP    Maikol Grassia, MD 04/08/16 [phone]

## 2016-04-09 LAB — CARDIOLIPIN ANTIBODIES, IGG, IGM, IGA: Anticardiolipin IgG: <9 GPL U/mL (ref 0–14)

## 2016-04-09 LAB — LUPUS ANTICOAGULANT PANEL
DRVVT: 39.7 s (ref 0.0–47.0)
PTT LA: 41.4 s (ref 0.0–51.9)

## 2016-04-09 LAB — BETA-2-GLYCOPROTEIN I ABS, IGG/M/A: Beta-2 Glyco I IgG: <9 GPI IgG units (ref 0–20)

## 2016-04-09 LAB — HEMOGLOBIN A1C
Hgb A1c MFr Bld: 5.2 % (ref 4.8–5.6)
Mean Plasma Glucose: 103 mg/dL

## 2016-04-09 LAB — PROTEIN S ACTIVITY: PROTEIN S ACTIVITY: 123 % (ref 63–140)

## 2016-04-09 LAB — PROTEIN S, TOTAL: PROTEIN S AG TOTAL: 70 % (ref 60–150)

## 2016-04-09 LAB — PROTEIN C ACTIVITY: PROTEIN C ACTIVITY: 120 % (ref 73–180)

## 2016-04-09 LAB — PROTEIN C, TOTAL: Protein C, Total: 82 % (ref 60–150)

## 2016-04-10 LAB — HOMOCYSTEINE: Homocysteine: 15.7 umol/L — ABNORMAL HIGH (ref 0.0–15.0)

## 2016-04-13 LAB — FACTOR 5 LEIDEN

## 2016-04-14 LAB — PROTHROMBIN GENE MUTATION

## 2016-07-10 ENCOUNTER — Encounter (HOSPITAL_COMMUNITY): Payer: Self-pay

## 2016-07-10 ENCOUNTER — Observation Stay (HOSPITAL_COMMUNITY)
Admission: EM | Admit: 2016-07-10 | Discharge: 2016-07-11 | Disposition: A | Discharge status: Home / Self Care | Payer: Managed Care, Other (non HMO) | Attending: Internal Medicine | Admitting: Internal Medicine

## 2016-07-10 ENCOUNTER — Emergency Department (HOSPITAL_COMMUNITY): Payer: Managed Care, Other (non HMO)

## 2016-07-10 DIAGNOSIS — H538 Other visual disturbances: Secondary | ICD-10-CM | POA: Insufficient documentation

## 2016-07-10 DIAGNOSIS — I779 Disorder of arteries and arterioles, unspecified: Secondary | ICD-10-CM | POA: Diagnosis present

## 2016-07-10 DIAGNOSIS — Z7982 Long term (current) use of aspirin: Secondary | ICD-10-CM | POA: Diagnosis not present

## 2016-07-10 DIAGNOSIS — G43909 Migraine, unspecified, not intractable, without status migrainosus: Secondary | ICD-10-CM | POA: Diagnosis not present

## 2016-07-10 DIAGNOSIS — E876 Hypokalemia: Secondary | ICD-10-CM | POA: Diagnosis not present

## 2016-07-10 DIAGNOSIS — Z8673 Personal history of transient ischemic attack (TIA), and cerebral infarction without residual deficits: Secondary | ICD-10-CM | POA: Insufficient documentation

## 2016-07-10 DIAGNOSIS — F419 Anxiety disorder, unspecified: Secondary | ICD-10-CM | POA: Diagnosis not present

## 2016-07-10 DIAGNOSIS — F909 Attention-deficit hyperactivity disorder, unspecified type: Secondary | ICD-10-CM | POA: Diagnosis present

## 2016-07-10 DIAGNOSIS — R569 Unspecified convulsions: Principal | ICD-10-CM

## 2016-07-10 DIAGNOSIS — I10 Essential (primary) hypertension: Secondary | ICD-10-CM | POA: Insufficient documentation

## 2016-07-10 DIAGNOSIS — M7710 Lateral epicondylitis, unspecified elbow: Secondary | ICD-10-CM | POA: Diagnosis not present

## 2016-07-10 DIAGNOSIS — D72829 Elevated white blood cell count, unspecified: Secondary | ICD-10-CM | POA: Insufficient documentation

## 2016-07-10 DIAGNOSIS — I739 Peripheral vascular disease, unspecified: Secondary | ICD-10-CM

## 2016-07-10 DIAGNOSIS — Z79899 Other long term (current) drug therapy: Secondary | ICD-10-CM | POA: Diagnosis not present

## 2016-07-10 DIAGNOSIS — M7712 Lateral epicondylitis, left elbow: Secondary | ICD-10-CM | POA: Diagnosis present

## 2016-07-10 DIAGNOSIS — Z7902 Long term (current) use of antithrombotics/antiplatelets: Secondary | ICD-10-CM | POA: Diagnosis not present

## 2016-07-10 LAB — CBC
HCT: 43.3 % (ref 39.0–52.0)
Hemoglobin: 14.9 g/dL (ref 13.0–17.0)
MCH: 29.2 pg (ref 26.0–34.0)
MCHC: 34.4 g/dL (ref 30.0–36.0)
MCV: 84.7 fL (ref 78.0–100.0)
Platelets: 265 10*3/uL (ref 150–400)
RBC: 5.11 MIL/uL (ref 4.22–5.81)
RDW: 13 % (ref 11.5–15.5)
WBC: 20 10*3/uL — ABNORMAL HIGH (ref 4.0–10.5)

## 2016-07-10 LAB — BASIC METABOLIC PANEL
Anion gap: 13 (ref 5–15)
BUN: 9 mg/dL (ref 6–20)
CO2: 25 mmol/L (ref 22–32)
Calcium: 9.2 mg/dL (ref 8.9–10.3)
Chloride: 99 mmol/L — ABNORMAL LOW (ref 101–111)
Creatinine, Ser: 0.98 mg/dL (ref 0.61–1.24)
GFR calc Af Amer: >60 mL/min (ref >60)
GFR calc non Af Amer: >60 mL/min (ref >60)
Glucose, Bld: 96 mg/dL (ref 65–99)
Potassium: 3.4 mmol/L — ABNORMAL LOW (ref 3.5–5.1)
Sodium: 137 mmol/L (ref 135–145)

## 2016-07-10 LAB — RAPID URINE DRUG SCREEN, HOSP PERFORMED
Amphetamines: POSITIVE — AB
Barbiturates: NOT DETECTED
Benzodiazepines: NOT DETECTED
Cocaine: NOT DETECTED
Opiates: NOT DETECTED
Tetrahydrocannabinol: NOT DETECTED

## 2016-07-10 LAB — MAGNESIUM: Magnesium: 2 mg/dL (ref 1.7–2.4)

## 2016-07-10 LAB — CBG MONITORING, ED: GLUCOSE-CAPILLARY: 100 mg/dL — AB (ref 65–99)

## 2016-07-10 MED ORDER — CARVEDILOL 6.25 MG PO TABS
6.2500 mg | ORAL_TABLET | Freq: Two times a day (BID) | ORAL | Status: DC
  Administered 2016-07-11 (×2): 6.25 mg via ORAL
  Filled 2016-07-10 (×2): qty 1

## 2016-07-10 MED ORDER — ENOXAPARIN SODIUM 40 MG/0.4ML ~~LOC~~ SOLN
40.0000 mg | SUBCUTANEOUS | Status: DC
  Administered 2016-07-11: 40 mg via SUBCUTANEOUS
  Filled 2016-07-10 (×2): qty 0.4

## 2016-07-10 MED ORDER — MAGNESIUM OXIDE 400 (241.3 MG) MG PO TABS
400.0000 mg | ORAL_TABLET | Freq: Every evening | ORAL | Status: DC
  Administered 2016-07-10 – 2016-07-11 (×2): 400 mg via ORAL
  Filled 2016-07-10 (×2): qty 1

## 2016-07-10 MED ORDER — POLYETHYLENE GLYCOL 3350 17 G PO PACK: 17.0000 g | PACK | Freq: Every day | ORAL | Status: DC | PRN

## 2016-07-10 MED ORDER — ONDANSETRON HCL 4 MG/2ML IJ SOLN: 4.0000 mg | Freq: Four times a day (QID) | INTRAMUSCULAR | Status: DC | PRN

## 2016-07-10 MED ORDER — KETOROLAC TROMETHAMINE 30 MG/ML IJ SOLN
30.0000 mg | Freq: Four times a day (QID) | INTRAMUSCULAR | Status: DC | PRN
  Administered 2016-07-10: 30 mg via INTRAVENOUS
  Filled 2016-07-10: qty 1

## 2016-07-10 MED ORDER — LOSARTAN POTASSIUM-HCTZ 100-25 MG PO TABS: 1.0000 | ORAL_TABLET | Freq: Every day | ORAL | Status: DC

## 2016-07-10 MED ORDER — ACETAMINOPHEN 325 MG PO TABS
650.0000 mg | ORAL_TABLET | Freq: Four times a day (QID) | ORAL | Status: DC | PRN
  Administered 2016-07-10: 650 mg via ORAL
  Filled 2016-07-10: qty 2

## 2016-07-10 MED ORDER — POTASSIUM CHLORIDE CRYS ER 20 MEQ PO TBCR
30.0000 meq | EXTENDED_RELEASE_TABLET | Freq: Once | ORAL | Status: AC
  Administered 2016-07-10: 30 meq via ORAL
  Filled 2016-07-10: qty 1

## 2016-07-10 MED ORDER — ALPRAZOLAM 0.5 MG PO TABS: 0.5000 mg | ORAL_TABLET | Freq: Four times a day (QID) | ORAL | Status: DC | PRN

## 2016-07-10 MED ORDER — SODIUM CHLORIDE 0.9 % IV SOLN
INTRAVENOUS | Status: AC
  Administered 2016-07-10: via INTRAVENOUS

## 2016-07-10 MED ORDER — HYDROCHLOROTHIAZIDE 25 MG PO TABS
25.0000 mg | ORAL_TABLET | Freq: Every day | ORAL | Status: DC
  Administered 2016-07-11: 25 mg via ORAL
  Filled 2016-07-10: qty 1

## 2016-07-10 MED ORDER — SODIUM CHLORIDE 0.9 % IV SOLN
1000.0000 mg | Freq: Once | INTRAVENOUS | Status: AC
  Administered 2016-07-10: 1000 mg via INTRAVENOUS
  Filled 2016-07-10: qty 10

## 2016-07-10 MED ORDER — LORAZEPAM 2 MG/ML IJ SOLN
2.0000 mg | INTRAMUSCULAR | Status: DC | PRN
Start: 2016-07-10 — End: 2016-07-11
  Filled 2016-07-10: qty 1

## 2016-07-10 MED ORDER — LOSARTAN POTASSIUM 50 MG PO TABS
100.0000 mg | ORAL_TABLET | Freq: Every day | ORAL | Status: DC
  Administered 2016-07-11: 100 mg via ORAL
  Filled 2016-07-10: qty 2

## 2016-07-10 MED ORDER — SODIUM CHLORIDE 0.9 % IV BOLUS (SEPSIS)
1000.0000 mL | Freq: Once | INTRAVENOUS | Status: AC
  Administered 2016-07-10: 1000 mL via INTRAVENOUS

## 2016-07-10 MED ORDER — ALPRAZOLAM 0.5 MG PO TABS
0.5000 mg | ORAL_TABLET | Freq: Once | ORAL | Status: AC
  Administered 2016-07-10: 0.5 mg via ORAL
  Filled 2016-07-10: qty 1

## 2016-07-10 MED ORDER — ATORVASTATIN CALCIUM 80 MG PO TABS
80.0000 mg | ORAL_TABLET | Freq: Every evening | ORAL | Status: DC
  Administered 2016-07-10 – 2016-07-11 (×2): 80 mg via ORAL
  Filled 2016-07-10 (×2): qty 1

## 2016-07-10 MED ORDER — ASPIRIN 325 MG PO TABS
325.0000 mg | ORAL_TABLET | Freq: Every day | ORAL | Status: DC
  Administered 2016-07-11: 325 mg via ORAL
  Filled 2016-07-10: qty 1

## 2016-07-10 MED ORDER — CLOPIDOGREL BISULFATE 75 MG PO TABS
75.0000 mg | ORAL_TABLET | Freq: Every day | ORAL | Status: DC
  Administered 2016-07-11: 75 mg via ORAL
  Filled 2016-07-10: qty 1

## 2016-07-10 MED ORDER — SODIUM CHLORIDE 0.9 % IV SOLN: 75.0000 mL/h | INTRAVENOUS | Status: DC

## 2016-07-10 MED ORDER — SODIUM CHLORIDE 0.9% FLUSH
3.0000 mL | Freq: Two times a day (BID) | INTRAVENOUS | Status: DC
  Administered 2016-07-10 – 2016-07-11 (×2): 3 mL via INTRAVENOUS

## 2016-07-10 MED ORDER — DULOXETINE HCL 60 MG PO CPEP
60.0000 mg | ORAL_CAPSULE | Freq: Every day | ORAL | Status: DC
  Administered 2016-07-11: 60 mg via ORAL
  Filled 2016-07-10: qty 1

## 2016-07-10 MED ORDER — ONDANSETRON HCL 4 MG PO TABS: 4.0000 mg | ORAL_TABLET | Freq: Four times a day (QID) | ORAL | Status: DC | PRN

## 2016-07-10 NOTE — H&P (Signed)
History and Physical    Edwin Roberson ZOX:[id number] DOB: 1981/04/03 DOA: 07/10/2016  PCP: Dois Davenport., MD   Patient coming from: Home  Chief Complaint: Seizure-like activity   HPI: Edwin Roberson is a 36 y.o. 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 Patient reports that he had been in his usual state of health and had an uneventful day at work today. He was walking out to his car to go home, began to feel lightheaded, developed blurred vision, and then remembers waking to find EMS with him getting ready for transport to the hospital. He was observed by coworkers to sit down in his car and then begin shaking. When they approached him, he was "stiff" and not responding. Upon EMS arrival, patient was found to be slightly confused, drowsy, and with a tongue bite. Patient had never experienced any similar symptoms. He had previously been on Depakote briefly for migraine suppression before it was stopped several weeks ago. He denies any use of alcohol or illicit substances. He denies any recent change in vision or hearing prior to this episode, and denies any focal numbness or weakness. There is been no recent fevers or chills, cough or dyspnea, chest pain or palpitations, abdominal pain, nausea, vomiting, or diarrhea.  ED Course: Upon arrival to the ED, patient is found to be afebrile, mildly tachycardic, saturating well on room air, and with vitals otherwise stable. EKG features a sinus tachycardia with rate 110 and noncontrast head CT is negative for acute intracranial abnormality. Stable generalized atrophy is noted on the head CT. Chemistry panel features a mild hypokalemia and is otherwise unremarkable. CBC is notable for leukocytosis to 20,000 with  remaining indices normal. UDS is positive for amphetamines only (patient is prescribed Adderall). Neurology was consulted by the ED physician and advised loading the patient with Keppra, obtaining EEG, and admitting the patient to the medical service for further evaluation and management of her seizure.     Review of Systems:  All other systems reviewed and apart from HPI, are negative.  Past Medical History:  Diagnosis Date  . Anxiety   . Hypertension   . Migraine   . PCL injury     Past Surgical History:  Procedure Laterality Date  . SHOULDER SURGERY Right 03/2014     reports that he has never smoked. He has never used smokeless tobacco. He reports that he does not drink alcohol or use drugs.  Allergies  Allergen Reactions  . Gabapentin Other (See Comments)    Made the patient feel spaced out  . Tramadol Other (See Comments)    Disorientation     Family History  Problem Relation Age of Onset  . Heart disease Father      Prior to Admission medications   Medication Sig Start Date End Date Taking? Authorizing Provider  acetaminophen (TYLENOL) 325 MG tablet Take 325-650 mg by mouth every 6 (six) hours as needed (pain or headaches).   Yes Historical Provider, MD  ALPRAZolam Prudy Feeler) 0.5 MG tablet Take 0.5 mg by mouth 4 (four) times daily as needed for anxiety. 11/21/13  Yes Historical Provider, MD  amphetamine-dextroamphetamine (ADDERALL XR) 30 MG 24 hr capsule Take 30 mg by mouth daily.   Yes Historical Provider, MD  aspirin 325 MG tablet Take 1 tablet (325 mg total) by mouth daily. 04/09/16  Yes Maretta Bees, MD  atorvastatin (LIPITOR) 80 MG tablet Take 80 mg by mouth every evening. 04/14/16  Yes Historical Provider, MD  carvedilol (COREG) 6.25 MG tablet Take 6.25 mg by mouth 2 (two) times daily with a meal.   Yes Historical Provider, MD  clopidogrel (PLAVIX) 75 MG tablet Take 75 mg by mouth daily. 04/14/16  Yes Historical Provider, MD  DULoxetine (CYMBALTA) 60 MG capsule  Take 60 mg by mouth daily. 05/21/16  Yes Historical Provider, MD  losartan-hydrochlorothiazide (HYZAAR) 100-25 MG per tablet Take 1 tablet by mouth daily. Reported on 06/12/2015   Yes Historical Provider, MD  magnesium oxide (MAG-OX) 400 MG tablet Take 400 mg by mouth every evening. 04/22/16  Yes Historical Provider, MD  promethazine (PHENERGAN) 25 MG tablet Take 50 mg by mouth daily as needed (for migraine-related nausea).  04/22/16  Yes Historical Provider, MD  divalproex (DEPAKOTE ER) 500 MG 24 hr tablet Take 1 tablet (500 mg total) by mouth daily. Patient not taking: Reported on 07/10/2016 04/09/16   Maretta Bees, MD    Physical Exam: Vitals:   07/10/16 2015 07/10/16 2047 07/10/16 2100 07/10/16 2115  BP: 158/91 167/72 168/71 151/90  Pulse: 105 103 107 109  Resp: 17 16  10   Temp:      TempSrc:      SpO2: 99% 96% 95% 96%  Weight:      Height:          Constitutional: NAD, calm, comfortable Eyes: PERTLA, lids and conjunctivae normal ENMT: Mucous membranes are moist. Posterior pharynx clear of any exudate or lesions.   Neck: normal, supple, no masses, no thyromegaly Respiratory: clear to auscultation bilaterally, no wheezing, no crackles. No accessory muscle use.  Cardiovascular: S1 & S2 heard, regular rate and rhythm. No extremity edema. No significant JVD. Abdomen: No distension, no tenderness, no masses palpated. Bowel sounds normal.  Musculoskeletal: no clubbing / cyanosis. Tender at left lateral epicondyle without swelling or redness. Normal muscle tone.  Skin: no significant rashes, lesions, ulcers. Warm, dry, well-perfused. Seborrhea about the face.  Neurologic: CN 2-12 grossly intact. Sensation intact, DTR normal. Strength 5/5 in all 4 limbs.  Psychiatric: Normal judgment and insight. Alert and oriented x 3. Normal mood and affect.     Labs on Admission: I have personally reviewed following labs and imaging studies  CBC:  Recent Labs Lab 07/10/16 2000  WBC 20.0*    HGB 14.9  HCT 43.3  MCV 84.7  PLT 265   Basic Metabolic Panel:  Recent Labs Lab 07/10/16 2020  NA 137  K 3.4*  CL 99*  CO2 25  GLUCOSE 96  BUN 9  CREATININE 0.98  CALCIUM 9.2  MG 2.0   GFR: Estimated Creatinine Clearance: 107.9 mL/min (by C-G formula based on SCr of 0.98 mg/dL). Liver Function Tests: No results for input(s): AST, ALT, ALKPHOS, BILITOT, PROT, ALBUMIN in the last 168 hours. No results for input(s): LIPASE, AMYLASE in the last 168 hours. No results for input(s): AMMONIA in the last 168 hours. Coagulation Profile: No results for input(s): INR, PROTIME in the last 168 hours. Cardiac Enzymes: No results for input(s): CKTOTAL, CKMB, CKMBINDEX, TROPONINI in the last 168 hours. BNP (last 3 results) No results for input(s): PROBNP in the last 8760 hours. HbA1C: No results for input(s): HGBA1C in the last 72 hours. CBG:  Recent Labs Lab 07/10/16 2005  GLUCAP 100*   Lipid Profile: No results for input(s): CHOL, HDL, LDLCALC, TRIG, CHOLHDL, LDLDIRECT in the  last 72 hours. Thyroid Function Tests: No results for input(s): TSH, T4TOTAL, FREET4, T3FREE, THYROIDAB in the last 72 hours. Anemia Panel: No results for input(s): VITAMINB12, FOLATE, FERRITIN, TIBC, IRON, RETICCTPCT in the last 72 hours. Urine analysis:    Component Value Date/Time   COLORURINE YELLOW 04/07/2016 2011   APPEARANCEUR CLEAR 04/07/2016 2011   LABSPEC 1.028 04/07/2016 2011   PHURINE 6.5 04/07/2016 2011   GLUCOSEU NEGATIVE 04/07/2016 2011   HGBUR NEGATIVE 04/07/2016 2011   BILIRUBINUR NEGATIVE 04/07/2016 2011   KETONESUR NEGATIVE 04/07/2016 2011   PROTEINUR NEGATIVE 04/07/2016 2011   NITRITE NEGATIVE 04/07/2016 2011   LEUKOCYTESUR NEGATIVE 04/07/2016 2011   Sepsis Labs: @LABRCNTIP (procalcitonin:4,lacticidven:4) )No results found for this or any previous visit (from the past 240 hour(s)).   Radiological Exams on Admission: Ct Head Wo Contrast  Result Date: 07/10/2016 CLINICAL  DATA:  Seizure. EXAM: CT HEAD WITHOUT CONTRAST TECHNIQUE: Contiguous axial images were obtained from the base of the skull through the vertex without intravenous contrast. COMPARISON:  Head CT and brain MRI 04/07/2016 FINDINGS: Brain: Stable cerebral atrophy and ventriculomegaly. No CT correlate to the tiny foci of acute infarct on prior MRI. 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 No hemorrhage, evidence of acute ischemia, mass effect or midline shift. Vascular: No hyperdense vessel or unexpected calcification. Skull: Normal. Negative for fracture or focal lesion. Sinuses/Orbits: Mucous retention cysts in the left maxillary sinus. Paranasal sinuses are otherwise clear. Mastoid air cells are well aerated. Orbits appear unremarkable. Other: None. IMPRESSION: 1.  No acute intracranial abnormality. 2. Stable generalized atrophy. No CT correlate to the small subcentimeter infarcts on prior MRI. Electronically Signed   By: Rubye Oaks M.D.   On: 07/10/2016 20:47    EKG: Independently reviewed. Sinus tachycardia (rate 110)  Assessment/Plan  1. Seizure, first  - Pt presents following seizure-like activity witnessed by co-workers, involving tongue bite, and followed by period of mild confusion and fatigue  - Head CT is negative for acute intracranial abnormalities  - Neurology is consulting and much appreciated  - Pt was loaded with Keppra in the ED and there has been no seizure-like activity since arrival  - UDS positive for amphetamines only (patient is prescribed Adderall); mild hypokalemia noted, mag is wnl; leukocytosis without fever or apparent source of infection   - EEG is ordered, will follow-up on additional neuro recs  - Likely secondary to recent CVA; Depakote withdrawal less-likely; Adderall may lower seizure threshold   2. Hx of CVA  - Pt was admitted in November 2017 with left-sided weakness, found to have 2 tiny infarcts (R parietal and R frontal)  - Workup  revealed atherosclerotic carotid disease with 60-79% occlusion and left and 80-99% occlusion on right; he was transferred to [facility] where he underwent rt CEA  - Pt denies any residual deficit and no focal deficit elicited on exam   - Continue ASA 325, Plavix, Lipitor    3. Carotid artery stenosis  - Pt is status-post right CEA in November 2018 at [facility] and follows-up there  - Continue ASA, Plavix, Lipitor    4. Anxiety  - Stable on admission  - Continue Xanax prn    5. Hypertension - BP at goal  - Continue losartan-HCTZ, Coreg    6. Migraine  - Stable - Continue Cymbalta   7. Hypokalemia  - Serum potassium is 3.4 on admission with normal mag level  - Replaced with 30 mEq oral potassium  - Repeat  chem panel in am    8. ADHD  - Stable - Managed with Adderall; held given potential lowering of seizure threshold    9. Lateral epicondylitis - Chronic, but worse after the seizure  - Treat with Toradol prn     DVT prophylaxis: sq Lovenox Code Status: Full  Family Communication: Wife updated at bedside Disposition Plan: Observe on telemetry Consults called: Neurology Admission status: [place]    Briscoe Deutscherimothy S Jacquise Rarick, MD [facility] Pager [phone]  If 7PM-7AM, please contact night-coverage [email or web address] Password [email or web address]  07/10/2016, 9:43 PM

## 2016-07-10 NOTE — ED Triage Notes (Addendum)
Pt coming from work with gems c/o seizure like activity witnessed by a Radio broadcast assistantcoworker. Pt started to feel dizzy and had blurred vision when he sat down and started having the seizure like activity. No hx of seizures. Pt does have hx of stroke last year but denies any neuro deficits from but did have his right jugular vein cleaned out. Upon ems arrival pt was pale and diaphoretic. Pt given 600ml NaCl and 4 mg zofran for nausea that has resolved. Pt has hx of migranes in which he was taking Depakote for 3 weeks but now takes Cymbalta. Pt hypertensive at 163/120. Sinus tach at 110. Other VSS. Pt a/ox4

## 2016-07-10 NOTE — ED Notes (Signed)
Called lab to add on mag and bmp

## 2016-07-10 NOTE — ED Provider Notes (Signed)
[facility]-EMERGENCY DEPT Provider Note   CSN: [id number] Arrival date & time: 07/10/16  1833     History   Chief Complaint Chief Complaint  Patient presents with  . Seizures    HPI Edwin Roberson is a 36 y.o. male.  HPI   36 year old malemigraines, hypertension age 36, hyperlipidemia, early family history of heart disease in his father age 35, recent right hemispheric stroke, bilateral carotid stenosis with symptomatically right stenosis requiring a recent right CEA presents today with seizure-like activity.  Patient notes that he was feeling fine in his normal health when at work today.  He notes walking to his car when he felt like he was going to pass out, started to see tunnel vision.  He remembers placing his hand on his car.  Bystanders note patient made it into his car and then appeared to have shaking that lasted less than 1 minute.  When they approach the patient in the car he was stiff and not alert.  Upon EMS arrival patient was still postictal, no seizure-like activity in route to the hospital.  At the time of evaluation patient reports he is feeling at his baseline, though slightly anxious.  Denies any significant headache, denies any neurological deficits.  He reports minor pain to his tongue and pain to his left elbow as he has chronic tendinitis of the elbow.  Patient denies any significant previous infectious etiology, denies any vomiting or diarrhea.  Denies any head trauma.  Patient does note a significant past medical history of CVA in November 2017.  Patient was noted to have significant carotid artery disease and [facility].  Patient has subsequently followed up with vascular surgery with carotid duplex done in January 2018 with no evidence of significant stenosis on the right, in 60-70 % stenosis of the left.   Past Medical History:  Diagnosis Date  . Anxiety   . Hypertension   . Migraine   . PCL injury     Patient  Active Problem List   Diagnosis Date Noted  . Carotid artery disease (HCC) 04/08/2016  . TIA (transient ischemic attack) 04/08/2016  . Anxiety 04/08/2016  . Migraine headache 04/08/2016  . Numbness and tingling in left hand 04/08/2016  . Near syncope 04/08/2016  . Acute CVA (cerebrovascular accident) (HCC) 04/08/2016  . HTN (hypertension) 07/11/2012    Past Surgical History:  Procedure Laterality Date  . SHOULDER SURGERY Right 03/2014     Home Medications    Prior to Admission medications   Medication Sig Start Date End Date Taking? Authorizing Provider  acetaminophen (TYLENOL) 325 MG tablet Take 325-650 mg by mouth every 6 (six) hours as needed (pain or headaches).   Yes Historical Provider, MD  ALPRAZolam Prudy Feeler(XANAX) 0.5 MG tablet Take 0.5 mg by mouth 4 (four) times daily as needed for anxiety. 11/21/13  Yes Historical Provider, MD  amphetamine-dextroamphetamine (ADDERALL XR) 30 MG 24 hr capsule Take 30 mg by mouth daily.   Yes Historical Provider, MD  aspirin 325 MG tablet Take 1 tablet (325 mg total) by mouth daily. 04/09/16  Yes Shanker Levora DredgeM Ghimire, MD  atorvastatin (LIPITOR) 80 MG tablet Take 80 mg by mouth every evening. 04/14/16  Yes Historical Provider, MD  carvedilol (COREG) 6.25 MG tablet Take 6.25 mg by mouth 2 (two) times daily with a meal.   Yes Historical Provider, MD  clopidogrel (PLAVIX) 75 MG tablet Take 75 mg by mouth daily. 04/14/16  Yes Historical Provider, MD  DULoxetine (  CYMBALTA) 60 MG capsule Take 60 mg by mouth daily. 05/21/16  Yes Historical Provider, MD  losartan-hydrochlorothiazide (HYZAAR) 100-25 MG per tablet Take 1 tablet by mouth daily. Reported on 06/12/2015   Yes Historical Provider, MD  magnesium oxide (MAG-OX) 400 MG tablet Take 400 mg by mouth every evening. 04/22/16  Yes Historical Provider, MD  promethazine (PHENERGAN) 25 MG tablet Take 50 mg by mouth daily as needed (for migraine-related nausea).  04/22/16  Yes Historical Provider, MD  atorvastatin  (LIPITOR) 40 MG tablet Take 1 tablet (40 mg total) by mouth daily at 6 PM. Patient not taking: Reported on 07/10/2016 04/08/16   Maretta Bees, MD  divalproex (DEPAKOTE ER) 500 MG 24 hr tablet Take 1 tablet (500 mg total) by mouth daily. Patient not taking: Reported on 07/10/2016 04/09/16   Maretta Bees, MD    Family History Family History  Problem Relation Age of Onset  . Heart disease Father     Social History Social History  Substance Use Topics  . Smoking status: Never Smoker  . Smokeless tobacco: Never Used  . Alcohol use No     Comment: occ     Allergies   Gabapentin and Tramadol   Review of Systems Review of Systems  All other systems reviewed and are negative.    Physical Exam Updated Vital Signs BP 167/72   Pulse 103   Temp 98.8 F (37.1 C) (Oral)   Resp 16   Ht 5\' 6"  (1.676 m)   Wt 83.9 kg   SpO2 96%   BMI 29.86 kg/m   Physical Exam  Constitutional: He is oriented to person, place, and time. He appears well-developed and well-nourished.  HENT:  Head: Normocephalic and atraumatic.  Eyes: Conjunctivae are normal. Pupils are equal, round, and reactive to light. Right eye exhibits no discharge. Left eye exhibits no discharge. No scleral icterus.  Neck: Normal range of motion. No JVD present. No tracheal deviation present.  Vertical incision scar of the right neck clean with no signs of infection  No carotid bruits noted right or left  Pulmonary/Chest: Effort normal. No stridor.  Neurological: He is alert and oriented to person, place, and time. He has normal strength. No cranial nerve deficit or sensory deficit. Coordination normal. GCS eye subscore is 4. GCS verbal subscore is 5. GCS motor subscore is 6.  Psychiatric: He has a normal mood and affect. His behavior is normal. Judgment and thought content normal.  Nursing note and vitals reviewed.    ED Treatments / Results  Labs (all labs ordered are listed, but only abnormal results are  displayed) Labs Reviewed  CBC - Abnormal; Notable for the following:       Result Value   WBC 20.0 (*)    All other components within normal limits  RAPID URINE DRUG SCREEN, HOSP PERFORMED - Abnormal; Notable for the following:    Amphetamines POSITIVE (*)    All other components within normal limits  BASIC METABOLIC PANEL - Abnormal; Notable for the following:    Potassium 3.4 (*)    Chloride 99 (*)    All other components within normal limits  CBG MONITORING, ED - Abnormal; Notable for the following:    Glucose-Capillary 100 (*)    All other components within normal limits  MAGNESIUM    EKG  EKG Interpretation  Date/Time:  Friday July 10 2016 18:40:28 EST Ventricular Rate:  110 PR Interval:    QRS Duration: 93 QT Interval:  357 QTC  Calculation: 483 R Axis:   52 Text Interpretation:  Since last tracing rate faster Sinus tachycardia Borderline prolonged QT interval Confirmed by Effie Shy  MD, ELLIOTT [id number]) on 07/10/2016 8:19:09 PM       Radiology Ct Head Wo Contrast  Result Date: 07/10/2016 CLINICAL DATA:  Seizure. EXAM: CT HEAD WITHOUT CONTRAST TECHNIQUE: Contiguous axial images were obtained from the base of the skull through the vertex without intravenous contrast. COMPARISON:  Head CT and brain MRI 04/07/2016 FINDINGS: Brain: Stable cerebral atrophy and ventriculomegaly. No CT correlate to the tiny foci of acute infarct on prior MRI. 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 No hemorrhage, evidence of acute ischemia, mass effect or midline shift. Vascular: No hyperdense vessel or unexpected calcification. Skull: Normal. Negative for fracture or focal lesion. Sinuses/Orbits: Mucous retention cysts in the left maxillary sinus. Paranasal sinuses are otherwise clear. Mastoid air cells are well aerated. Orbits appear unremarkable. Other: None. IMPRESSION: 1.  No acute intracranial abnormality. 2. Stable generalized atrophy. No CT correlate to the small  subcentimeter infarcts on prior MRI. Electronically Signed   By: Rubye Oaks M.D.   On: 07/10/2016 20:47    Procedures Procedures (including critical care time)  Medications Ordered in ED Medications  levETIRAcetam (KEPPRA) 1,000 mg in sodium chloride 0.9 % 100 mL IVPB (1,000 mg Intravenous New Bag/Given 07/10/16 2100)  ALPRAZolam Prudy Feeler) tablet 0.5 mg (0.5 mg Oral Given 07/10/16 1959)  sodium chloride 0.9 % bolus 1,000 mL (1,000 mLs Intravenous New Bag/Given 07/10/16 2000)     Initial Impression / Assessment and Plan / ED Course  I have reviewed the triage vital signs and the nursing notes.  Pertinent labs & imaging results that were available during my care of the patient were reviewed by me and considered in my medical decision making (see chart for details).      Final Clinical Impressions(s) / ED Diagnoses   Final diagnoses:  Seizure (HCC)    Labs: BMP, magnesium, CVG, CBC  Imaging: CT head without  Consults:  Therapeutics: Keppra  Discharge Meds:   Assessment/Plan: 36 year old male with seizure activity presents today.  He is at baseline through my evaluation, with recent history of stroke and for seizure neurology was consulted.  Neurology recommended loading patient with Keppra here, admission to hospitalist service with EEG.  He reports he will evaluate the patient for further recommendations.  [facility] agrees for admission and will place orders      New Prescriptions New Prescriptions   No medications on file     Eyvonne Mechanic, PA-C 07/10/16 2112    Mancel Bale, MD 07/12/16 [phone]

## 2016-07-11 ENCOUNTER — Observation Stay (HOSPITAL_COMMUNITY): Payer: Managed Care, Other (non HMO)

## 2016-07-11 DIAGNOSIS — E876 Hypokalemia: Secondary | ICD-10-CM

## 2016-07-11 DIAGNOSIS — G43809 Other migraine, not intractable, without status migrainosus: Secondary | ICD-10-CM | POA: Diagnosis not present

## 2016-07-11 DIAGNOSIS — I10 Essential (primary) hypertension: Secondary | ICD-10-CM | POA: Diagnosis not present

## 2016-07-11 DIAGNOSIS — F909 Attention-deficit hyperactivity disorder, unspecified type: Secondary | ICD-10-CM | POA: Diagnosis not present

## 2016-07-11 DIAGNOSIS — R569 Unspecified convulsions: Secondary | ICD-10-CM

## 2016-07-11 DIAGNOSIS — F419 Anxiety disorder, unspecified: Secondary | ICD-10-CM

## 2016-07-11 DIAGNOSIS — I779 Disorder of arteries and arterioles, unspecified: Secondary | ICD-10-CM | POA: Diagnosis not present

## 2016-07-11 DIAGNOSIS — M7712 Lateral epicondylitis, left elbow: Secondary | ICD-10-CM

## 2016-07-11 DIAGNOSIS — Z8673 Personal history of transient ischemic attack (TIA), and cerebral infarction without residual deficits: Secondary | ICD-10-CM

## 2016-07-11 LAB — COMPREHENSIVE METABOLIC PANEL
ALT: 18 U/L (ref 17–63)
ANION GAP: 8 (ref 5–15)
AST: 22 U/L (ref 15–41)
Albumin: 3.6 g/dL (ref 3.5–5.0)
Alkaline Phosphatase: 32 U/L — ABNORMAL LOW (ref 38–126)
BUN: 10 mg/dL (ref 6–20)
CHLORIDE: 103 mmol/L (ref 101–111)
CO2: 27 mmol/L (ref 22–32)
CREATININE: 1 mg/dL (ref 0.61–1.24)
Calcium: 8.8 mg/dL — ABNORMAL LOW (ref 8.9–10.3)
Glucose, Bld: 107 mg/dL — ABNORMAL HIGH (ref 65–99)
Potassium: 3.6 mmol/L (ref 3.5–5.1)
SODIUM: 138 mmol/L (ref 135–145)
Total Bilirubin: 0.8 mg/dL (ref 0.3–1.2)
Total Protein: 6.1 g/dL — ABNORMAL LOW (ref 6.5–8.1)

## 2016-07-11 LAB — HIV ANTIBODY (ROUTINE TESTING W REFLEX): HIV SCREEN 4TH GENERATION: NONREACTIVE

## 2016-07-11 MED ORDER — LORAZEPAM 2 MG/ML IJ SOLN
1.0000 mg | Freq: Once | INTRAMUSCULAR | Status: AC
  Administered 2016-07-11: 1 mg via INTRAVENOUS

## 2016-07-11 MED ORDER — GADOBENATE DIMEGLUMINE 529 MG/ML IV SOLN
18.0000 mL | Freq: Once | INTRAVENOUS | Status: AC | PRN
  Administered 2016-07-11: 18 mL via INTRAVENOUS

## 2016-07-11 MED ORDER — LEVETIRACETAM 500 MG PO TABS
500.0000 mg | ORAL_TABLET | Freq: Two times a day (BID) | ORAL | 0 refills | Status: AC
Start: 2016-07-11 — End: ?

## 2016-07-11 MED ORDER — SODIUM CHLORIDE 0.9 % IV SOLN
500.0000 mg | Freq: Two times a day (BID) | INTRAVENOUS | Status: DC
  Administered 2016-07-11: 500 mg via INTRAVENOUS
  Filled 2016-07-11 (×2): qty 5

## 2016-07-11 NOTE — Discharge Summary (Signed)
Physician Discharge Summary  Tiger Spieker ONG:[id number] DOB: Jan 30, 1981 DOA: 07/10/2016  PCP: Dois Davenport., MD  Admit date: 07/10/2016 Discharge date: 07/11/2016  Recommendations for Outpatient Follow-up:  1. Pt will need to follow up with PCP in 1-2 weeks post discharge 2. Please obtain BMP to evaluate electrolytes and kidney function 3. Check CBC to evaluate WBC count to make sure trending down  4. Please note that Depakote was removed from pt's list as he saud he no longer takes the medication 5. Pt started on Keppra 6. Pt advised to discuss with PCP stopping and changing Adderall to another medication as Adderall lowers seizure threshold  7. Pt verbalized understanding of risks of continuing this mediation but wants to continue for now as he says he has been on it for a while and does not want to stop it until he has another mediation available   History of present illness:  36 y.o. 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 Patient reports that he had been in his usual state of health and had an uneventful day at work. He was walking out to his car to go home, began to feel lightheaded, developed blurred vision, and then remembers waking to find EMS with him getting ready for transport to the hospital.   Discharge Diagnoses and hospital progress  Principal Problem:   Seizure (HCC) - MRI brain with no acute findings, neurology recommended continuing Keppra and no need for EEG at this time - also discussed with pt that Adderall lower seizure threshold and could have contributed - pt chooses to continue Adderall  - Script for Keppra provided and pt wants to go home, neurology team cleared for discharge   Active Problems:   HTN (hypertension) -  reasonable inpatient control  - continue home medical regimen     Hypokalemia - supplemented and WNL this AM    Leukocytosis - reactive - no signs and symptoms suggestive of an infectious etiology   Discharge Condition: Stable  Diet recommendation: Heart healthy diet discussed in details   Procedures/Studies: Ct Head Wo Contrast  Result Date: 07/10/2016 CLINICAL DATA:  Seizure. EXAM: CT HEAD WITHOUT CONTRAST TECHNIQUE: Contiguous axial images were obtained from the base of the skull through the vertex without intravenous contrast. COMPARISON:  Head CT and brain MRI 04/07/2016 FINDINGS: Brain: Stable cerebral atrophy and ventriculomegaly. No CT correlate to the tiny foci of acute infarct on prior MRI. 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 No hemorrhage, evidence of acute ischemia, mass effect or midline shift. Vascular: No hyperdense vessel or unexpected calcification. Skull: Normal. Negative for fracture or focal lesion. Sinuses/Orbits: Mucous retention cysts in the left maxillary sinus. Paranasal sinuses are otherwise clear. Mastoid air cells are well aerated. Orbits appear unremarkable. Other: None. IMPRESSION: 1.  No acute intracranial abnormality. 2. Stable generalized atrophy. No CT correlate to the small subcentimeter infarcts on prior MRI. Electronically Signed   By: Rubye Oaks M.D.   On: 07/10/2016 20:47   Mr Maxine Glenn Head Wo Contrast  Result Date: 07/11/2016 CLINICAL DATA:  New onset seizure activity. History of carotid stenosis and endarterectomy. Prior stroke. EXAM: MR HEAD WITHOUT CONTRAST MR CIRCLE OF WILLIS WITHOUT CONTRAST MRA OF THE NECK WITHOUT AND WITH CONTRAST TECHNIQUE: Multiplanar, multiecho pulse sequences of the brain,  circle of willis and surrounding structures were obtained without intravenous contrast. Angiographic images of the neck were obtained using MRA technique without and with intravenous contrast. CONTRAST:  18mL MULTIHANCE GADOBENATE  DIMEGLUMINE 529 MG/ML IV SOLN COMPARISON:  Head CT 07/10/2016 and MRI 04/08/2016. Head and neck CTA 04/07/2016. FINDINGS: MR HEAD FINDINGS Brain: Coronal oblique temporal lobe imaging is mildly motion degraded. The hippocampal eye are symmetric in size without definite signal abnormality identified. Moderate ventriculomegaly is unchanged from the prior MRI and attributed to age advanced central cerebral atrophy/white matter volume loss. A few small, subtle foci of nodularity along the left greater than lateral ventricles are unchanged. There is no evidence of acute infarct, intracranial hemorrhage, mass, midline shift, or extra-axial fluid collection. No significant cortical encephalomalacia is identified at the sites of the 2 punctate acute infarcts on the prior MRI. Vascular: Major intracranial vascular flow voids are preserved. Skull and upper cervical spine: Unremarkable bone marrow signal. Sinuses/Orbits: Unremarkable orbits. Left maxillary sinus mucous retention cysts, unchanged. Clear mastoid air cells. Other: None. MR CIRCLE OF WILLIS FINDINGS The study is mildly motion degraded. The visualized distal vertebral arteries are widely patent and codominant. Right AICA is dominant. SCA origins are patent. Basilar artery is widely patent. Posterior communicating arteries are diminutive or absent. PCAs are patent without evidence of significant stenosis. The internal carotid arteries are widely patent from skullbase to carotid termini. Anterior communicating artery is unremarkable. ACAs and MCAs are patent without evidence of major branch occlusion or significant proximal stenosis. No intracranial aneurysm is identified. MRA NECK FINDINGS Common origin of the brachiocephalic and left common carotid arteries. Brachiocephalic and right subclavian arteries are widely patent. Proximal left subclavian artery was not imaged. There has been interval right carotid endarterectomy since the prior CTA. There is mild luminal  irregularity involving the distal common and proximal internal and external carotid arteries on the right without evidence of significant recurrent stenosis. The left common carotid artery is widely patent. There is approximately 70% stenosis of the left ICA at its origin which is stable to mildly progressed upon remeasurement of the stenosis on the prior CTA. The vertebral arteries are patent and codominant with antegrade flow bilaterally. There is no evidence of vertebral artery stenosis or dissection. IMPRESSION: 1. No acute intracranial abnormality. 2. 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 3. Unremarkable head MRA. 4. Prior right carotid endarterectomy without recurrent stenosis. 5. 70% proximal left ICA stenosis, stable to slightly increased. Electronically Signed   By: Sebastian AcheAllen  Grady M.D.   On: 07/11/2016 14:56   Mr Maxine GlennMra Neck W Wo Contrast  Result Date: 07/11/2016 CLINICAL DATA:  New onset seizure activity. History of carotid stenosis and endarterectomy. Prior stroke. EXAM: MR HEAD WITHOUT CONTRAST MR CIRCLE OF WILLIS WITHOUT CONTRAST MRA OF THE NECK WITHOUT AND WITH CONTRAST TECHNIQUE: Multiplanar, multiecho pulse sequences of the brain, circle of willis and surrounding structures were obtained without intravenous contrast. Angiographic images of the neck were obtained using MRA technique without and with intravenous contrast. CONTRAST:  18mL MULTIHANCE GADOBENATE DIMEGLUMINE 529 MG/ML IV SOLN COMPARISON:  Head CT 07/10/2016 and MRI 04/08/2016. Head and neck CTA 04/07/2016. FINDINGS: MR HEAD FINDINGS Brain: Coronal oblique temporal lobe imaging is mildly motion degraded. The hippocampal eye are symmetric in size without definite signal abnormality identified. Moderate ventriculomegaly is unchanged from the prior MRI and attributed to age advanced central cerebral atrophy/white matter volume loss. A few small, subtle foci of nodularity along the left greater than  lateral ventricles  are unchanged. There is no evidence of acute infarct, intracranial hemorrhage, mass, midline shift, or extra-axial fluid collection. No significant cortical encephalomalacia is identified at the sites of the 2 punctate acute infarcts on the prior MRI. Vascular: Major intracranial vascular flow voids are preserved. Skull and upper cervical spine: Unremarkable bone marrow signal. Sinuses/Orbits: Unremarkable orbits. Left maxillary sinus mucous retention cysts, unchanged. Clear mastoid air cells. Other: None. MR CIRCLE OF WILLIS FINDINGS The study is mildly motion degraded. The visualized distal vertebral arteries are widely patent and codominant. Right AICA is dominant. SCA origins are patent. Basilar artery is widely patent. Posterior communicating arteries are diminutive or absent. PCAs are patent without evidence of significant stenosis. The internal carotid arteries are widely patent from skullbase to carotid termini. Anterior communicating artery is unremarkable. ACAs and MCAs are patent without evidence of major branch occlusion or significant proximal stenosis. No intracranial aneurysm is identified. MRA NECK FINDINGS Common origin of the brachiocephalic and left common carotid arteries. Brachiocephalic and right subclavian arteries are widely patent. Proximal left subclavian artery was not imaged. There has been interval right carotid endarterectomy since the prior CTA. There is mild luminal irregularity involving the distal common and proximal internal and external carotid arteries on the right without evidence of significant recurrent stenosis. The left common carotid artery is widely patent. There is approximately 70% stenosis of the left ICA at its origin which is stable to mildly progressed upon remeasurement of the stenosis on the prior CTA. The vertebral arteries are patent and codominant with antegrade flow bilaterally. There is no evidence of vertebral artery stenosis or dissection.  IMPRESSION: 1. No acute intracranial abnormality. 2. 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 3. Unremarkable head MRA. 4. Prior right carotid endarterectomy without recurrent stenosis. 5. 70% proximal left ICA stenosis, stable to slightly increased. Electronically Signed   By: Sebastian Ache M.D.   On: 07/11/2016 14:56   Mr Brain Wo Contrast  Result Date: 07/11/2016 CLINICAL DATA:  New onset seizure activity. History of carotid stenosis and endarterectomy. Prior stroke. EXAM: MR HEAD WITHOUT CONTRAST MR CIRCLE OF WILLIS WITHOUT CONTRAST MRA OF THE NECK WITHOUT AND WITH CONTRAST TECHNIQUE: Multiplanar, multiecho pulse sequences of the brain, circle of willis and surrounding structures were obtained without intravenous contrast. Angiographic images of the neck were obtained using MRA technique without and with intravenous contrast. CONTRAST:  18mL MULTIHANCE GADOBENATE DIMEGLUMINE 529 MG/ML IV SOLN COMPARISON:  Head CT 07/10/2016 and MRI 04/08/2016. Head and neck CTA 04/07/2016. FINDINGS: MR HEAD FINDINGS Brain: Coronal oblique temporal lobe imaging is mildly motion degraded. The hippocampal eye are symmetric in size without definite signal abnormality identified. Moderate ventriculomegaly is unchanged from the prior MRI and attributed to age advanced central cerebral atrophy/white matter volume loss. A few small, subtle foci of nodularity along the left greater than lateral ventricles are unchanged. There is no evidence of acute infarct, intracranial hemorrhage, mass, midline shift, or extra-axial fluid collection. No significant cortical encephalomalacia is identified at the sites of the 2 punctate acute infarcts on the prior MRI. Vascular: Major intracranial vascular flow voids are preserved. Skull and upper cervical spine: Unremarkable bone marrow signal. Sinuses/Orbits: Unremarkable orbits. Left maxillary sinus mucous retention cysts, unchanged. Clear mastoid air cells.  Other: None. MR CIRCLE OF WILLIS FINDINGS The study is mildly motion degraded. The visualized distal vertebral arteries are widely patent and codominant. Right AICA is dominant. SCA origins are patent. Basilar artery is widely patent. Posterior communicating arteries are diminutive or absent.  PCAs are patent without evidence of significant stenosis. The internal carotid arteries are widely patent from skullbase to carotid termini. Anterior communicating artery is unremarkable. ACAs and MCAs are patent without evidence of major branch occlusion or significant proximal stenosis. No intracranial aneurysm is identified. MRA NECK FINDINGS Common origin of the brachiocephalic and left common carotid arteries. Brachiocephalic and right subclavian arteries are widely patent. Proximal left subclavian artery was not imaged. There has been interval right carotid endarterectomy since the prior CTA. There is mild luminal irregularity involving the distal common and proximal internal and external carotid arteries on the right without evidence of significant recurrent stenosis. The left common carotid artery is widely patent. There is approximately 70% stenosis of the left ICA at its origin which is stable to mildly progressed upon remeasurement of the stenosis on the prior CTA. The vertebral arteries are patent and codominant with antegrade flow bilaterally. There is no evidence of vertebral artery stenosis or dissection. IMPRESSION: 1. No acute intracranial abnormality. 2. 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 3. Unremarkable head MRA. 4. Prior right carotid endarterectomy without recurrent stenosis. 5. 70% proximal left ICA stenosis, stable to slightly increased. Electronically Signed   By: Sebastian Ache M.D.   On: 07/11/2016 14:56    Discharge Exam: Vitals:   07/10/16 2230 07/11/16 0622  BP: 145/87 125/81  Pulse: 97 81  Resp: 15 16  Temp:  97.7 F (36.5 C)   Vitals:    07/10/16 2100 07/10/16 2115 07/10/16 2230 07/11/16 0622  BP: 168/71 151/90 145/87 125/81  Pulse: 107 109 97 81  Resp:  10 15 16   Temp:    97.7 F (36.5 C)  TempSrc:    Oral  SpO2: 95% 96% 96% 99%  Weight:      Height:        General: Pt is alert, follows commands appropriately, not in acute distress Cardiovascular: Regular rate and rhythm, S1/S2 +, no rubs, no gallops Respiratory: Clear to auscultation bilaterally, no wheezing, no crackles, no rhonchi Abdominal: Soft, non tender, non distended, bowel sounds +, no guarding Extremities: no edema, no cyanosis, pulses palpable bilaterally DP and PT Neuro: Grossly nonfocal  Discharge Instructions  Discharge Instructions    Diet - low sodium heart healthy    Complete by:  As directed    Increase activity slowly    Complete by:  As directed      Allergies as of 07/11/2016      Reactions   Gabapentin Other (See Comments)   Made the patient feel spaced out   Tramadol Other (See Comments)   Disorientation      Medication List    STOP taking these medications   divalproex 500 MG 24 hr tablet Commonly known as:  DEPAKOTE ER     TAKE these medications   acetaminophen 325 MG tablet Commonly known as:  TYLENOL Take 325-650 mg by mouth every 6 (six) hours as needed (pain or headaches).   ALPRAZolam 0.5 MG tablet Commonly known as:  XANAX Take 0.5 mg by mouth 4 (four) times daily as needed for anxiety.   amphetamine-dextroamphetamine 30 MG 24 hr capsule Commonly known as:  ADDERALL XR Take 30 mg by mouth daily.   aspirin 325 MG tablet Take 1 tablet (325 mg total) by mouth daily.   atorvastatin 80 MG tablet Commonly known as:  LIPITOR Take 80 mg by mouth every evening.   carvedilol 6.25 MG tablet Commonly known as:  COREG Take 6.25 mg by  mouth 2 (two) times daily with a meal.   clopidogrel 75 MG tablet Commonly known as:  PLAVIX Take 75 mg by mouth daily.   DULoxetine 60 MG capsule Commonly known as:   CYMBALTA Take 60 mg by mouth daily.   levETIRAcetam 500 MG tablet Commonly known as:  KEPPRA Take 1 tablet (500 mg total) by mouth 2 (two) times daily.   losartan-hydrochlorothiazide 100-25 MG tablet Commonly known as:  HYZAAR Take 1 tablet by mouth daily. Reported on 06/12/2015   magnesium oxide 400 MG tablet Commonly known as:  MAG-OX Take 400 mg by mouth every evening.   promethazine 25 MG tablet Commonly known as:  PHENERGAN Take 50 mg by mouth daily as needed (for migraine-related nausea).       Follow-up Information    Dois Davenport., MD Follow up.   Specialty:  Family Medicine Contact information: [address] [place] [address] [phone]            The results of significant diagnostics from this hospitalization (including imaging, microbiology, ancillary and laboratory) are listed below for reference.     Microbiology: No results found for this or any previous visit (from the past 240 hour(s)).   Labs: Basic Metabolic Panel:  Recent Labs Lab 07/10/16 2020 07/11/16 0434  NA 137 138  K 3.4* 3.6  CL 99* 103  CO2 25 27  GLUCOSE 96 107*  BUN 9 10  CREATININE 0.98 1.00  CALCIUM 9.2 8.8*  MG 2.0  --    Liver Function Tests:  Recent Labs Lab 07/11/16 0434  AST 22  ALT 18  ALKPHOS 32*  BILITOT 0.8  PROT 6.1*  ALBUMIN 3.6   CBC:  Recent Labs Lab 07/10/16 2000  WBC 20.0*  HGB 14.9  HCT 43.3  MCV 84.7  PLT 265   CBG:  Recent Labs Lab 07/10/16 2005  GLUCAP 100*   SIGNED: Time coordinating discharge: 30 minutes  Debbora Presto, MD  [facility] 07/11/2016, 3:31 PM Pager [phone]  If 7PM-7AM, please contact night-coverage [email or web address] Password [email or web address]

## 2016-07-11 NOTE — Progress Notes (Signed)
Edwin Roberson to be D/C'd Home per MD order.  Discussed with the patient and all questions fully answered.  VSS, Skin clean, dry and intact without evidence of skin break down, no evidence of skin tears noted. IV catheter discontinued intact. Site without signs and symptoms of complications. Dressing and pressure applied.  An After Visit Summary was printed and given to the patient. Patient received prescription.  D/c education completed with patient/family including follow up instructions, medication list, d/c activities limitations if indicated, with other d/c instructions as indicated by MD - patient able to verbalize understanding, all questions fully answered.   Patient instructed to return to ED, call 911, or call MD for any changes in condition.   Patient escorted via WC, and D/C home via private auto.  Edwin Roberson 07/11/2016 4:59 PM

## 2016-07-11 NOTE — Consult Note (Signed)
NEURO HOSPITALIST CONSULT NOTE   Requestig physician: Dr. Izola PriceMyers  Reason for Consult: New onset seizure  History obtained from:   Patient and Chart     HPI:                                                                                                                                          Edwin Roberson is an 36 y.o. male with a recent history of ischemic stroke followed by right CEA in November of last year, who presents with new onset seizure. His last memory prior to the event was leaving work, when he began to feel lightheaded with blurred vision. His first memory after the episode is waking up with EMS getting ready to transport him to the hospital. Co-workers witnessed him to be exhibiting jerking movements in his car. He was "stiff" and unresponsive when they came over to help. EMS on arrival noted him to be confused and drowsy, with a tongue bite. The patient has no prior history of seizures. He was on Depakote for migraines but stopped taking this medication several weeks ago. The patient does not endorse any weakness or sensory loss, as well as no recent symptoms of infection. He denies withdrawing from EtOH or benzodiazepines. The patient takes Adderall. He works at a [employer].   CT of head was negative for acute abnormality.       Past Medical History:  Diagnosis Date  . Anxiety   . Hypertension   . Migraine   . PCL injury     Past Surgical History:  Procedure Laterality Date  . SHOULDER SURGERY Right 03/2014    Family History  Problem Relation Age of Onset  . Heart disease Father    Social History:  reports that he has never smoked. He has never used smokeless tobacco. He reports that he does not drink alcohol or use drugs.  Allergies  Allergen Reactions  . Gabapentin Other (See Comments)    Made the patient feel spaced out  . Tramadol Other (See Comments)    Disorientation     MEDICATIONS:  Prior to Admission:  Prescriptions Prior to Admission  Medication Sig Dispense Refill Last Dose  . acetaminophen (TYLENOL) 325 MG tablet Take 325-650 mg by mouth every 6 (six) hours as needed (pain or headaches).   PRN at PRN  . ALPRAZolam (XANAX) 0.5 MG tablet Take 0.5 mg by mouth 4 (four) times daily as needed for anxiety.   07/08/2016 at pm  . amphetamine-dextroamphetamine (ADDERALL XR) 30 MG 24 hr capsule Take 30 mg by mouth daily.   07/10/2016 at am  . aspirin 325 MG tablet Take 1 tablet (325 mg total) by mouth daily.   07/10/2016 at 0730  . atorvastatin (LIPITOR) 80 MG tablet Take 80 mg by mouth every evening.   07/09/2016 at pm  . carvedilol (COREG) 6.25 MG tablet Take 6.25 mg by mouth 2 (two) times daily with a meal.   07/10/2016 at 0730  . clopidogrel (PLAVIX) 75 MG tablet Take 75 mg by mouth daily.   07/10/2016 at 0715  . DULoxetine (CYMBALTA) 60 MG capsule Take 60 mg by mouth daily.   07/10/2016 at am  . losartan-hydrochlorothiazide (HYZAAR) 100-25 MG per tablet Take 1 tablet by mouth daily. Reported on 06/12/2015   07/10/2016 at am  . magnesium oxide (MAG-OX) 400 MG tablet Take 400 mg by mouth every evening.   07/09/2016 at pm  . promethazine (PHENERGAN) 25 MG tablet Take 50 mg by mouth daily as needed (for migraine-related nausea).    PRN at PRN  . divalproex (DEPAKOTE ER) 500 MG 24 hr tablet Take 1 tablet (500 mg total) by mouth daily. (Patient not taking: Reported on 07/10/2016)   Not Taking at Unknown time   Scheduled: . aspirin  325 mg Oral Daily  . atorvastatin  80 mg Oral QPM  . carvedilol  6.25 mg Oral BID WC  . clopidogrel  75 mg Oral Daily  . DULoxetine  60 mg Oral Daily  . enoxaparin (LOVENOX) injection  40 mg Subcutaneous Q24H  . hydrochlorothiazide  25 mg Oral Daily  . losartan  100 mg Oral Daily  . magnesium oxide  400 mg Oral QPM  . sodium chloride flush  3 mL Intravenous Q12H      ROS:                                                                                                                                       As per HPI.   Blood pressure 125/81, pulse 81, temperature 97.7 F (36.5 C), temperature source Oral, resp. rate 16, height 5\' 6"  (1.676 m), weight 83.9 kg (185 lb), SpO2 99 %.  General Examination:  HEENT-  Normocephalic/atraumatic.  Lungs- Respirations unlabored.  Extremities- Warm and well perfused  Neurological Examination Mental Status: Alert, oriented, thought content appropriate.  Speech fluent without evidence of aphasia.  Able to follow all commands without difficulty. Cranial Nerves: II:  Visual fields grossly normal, pupils equal, round, reactive to light  III,IV, VI: ptosis not present, extra-ocular motions intact bilaterally V,VII: smile symmetric, facial temperature sensation normal bilaterally VIII: hearing intact to conversation IX,X: no hypophonia XI: Symmetric XII: midline tongue extension Motor: Right : Upper extremity   4+/5    Left:     Upper extremity   4+/5  Lower extremity   5/5     Lower extremity   5/5 Sensory: Temperature and light touch intact x 4 without extinction Deep Tendon Reflexes: 3+ biceps and brachioradialis bilaterally. 4+ right patella, 3+ left patella, 2+ achilles bilaterally.  Cerebellar: No ataxia with FNF bilaterally.  Gait: Deferred  Lab Results: Basic Metabolic Panel:  Recent Labs Lab 07/10/16 2020 07/11/16 0434  NA 137 138  K 3.4* 3.6  CL 99* 103  CO2 25 27  GLUCOSE 96 107*  BUN 9 10  CREATININE 0.98 1.00  CALCIUM 9.2 8.8*  MG 2.0  --     Liver Function Tests:  Recent Labs Lab 07/11/16 0434  AST 22  ALT 18  ALKPHOS 32*  BILITOT 0.8  PROT 6.1*  ALBUMIN 3.6   No results for input(s): LIPASE, AMYLASE in the last 168 hours. No results for input(s): AMMONIA in the last 168  hours.  CBC:  Recent Labs Lab 07/10/16 2000  WBC 20.0*  HGB 14.9  HCT 43.3  MCV 84.7  PLT 265    Cardiac Enzymes: No results for input(s): CKTOTAL, CKMB, CKMBINDEX, TROPONINI in the last 168 hours.  Lipid Panel: No results for input(s): CHOL, TRIG, HDL, CHOLHDL, VLDL, LDLCALC in the last 168 hours.  CBG:  Recent Labs Lab 07/10/16 2005  GLUCAP 100*    Microbiology: No results found for this or any previous visit.  Coagulation Studies: No results for input(s): LABPROT, INR in the last 72 hours.  Imaging: Ct Head Wo Contrast  Result Date: 07/10/2016 CLINICAL DATA:  Seizure. EXAM: CT HEAD WITHOUT CONTRAST TECHNIQUE: Contiguous axial images were obtained from the base of the skull through the vertex without intravenous contrast. COMPARISON:  Head CT and brain MRI 04/07/2016 FINDINGS: Brain: Stable cerebral atrophy and ventriculomegaly. No CT correlate to the tiny foci of acute infarct on prior MRI. 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 No hemorrhage, evidence of acute ischemia, mass effect or midline shift. Vascular: No hyperdense vessel or unexpected calcification. Skull: Normal. Negative for fracture or focal lesion. Sinuses/Orbits: Mucous retention cysts in the left maxillary sinus. Paranasal sinuses are otherwise clear. Mastoid air cells are well aerated. Orbits appear unremarkable. Other: None. IMPRESSION: 1.  No acute intracranial abnormality. 2. Stable generalized atrophy. No CT correlate to the small subcentimeter infarcts on prior MRI. Electronically Signed   By: Rubye Oaks M.D.   On: 07/10/2016 20:47    Assessment: 1. New onset seizure-like activity. 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  2. Recent prior ischemic stroke, secondary to atherosclerotic  disease.  3. Status post right CEA.   Recommendations: 1. Loaded with Keppra 1000 mg.  2. Start scheduled  Keppra 500 mg PO BID.  3. EEG.  4. The patient has been advised not to drive until seizure free for at least 6 months. He expressed understanding and agreement with the plan.  5. Continue Lipitor, ASA and Plavix for secondary stroke prevention.  6. Consider discontinuing Adderall, as this medication can lower the seizure threshold.    Electronically signed: Dr. Caryl Pina 07/11/2016, 8:10 AM

## 2016-07-11 NOTE — Progress Notes (Signed)
Please see not from Dr. Otelia LimesLindzen form this AM.   New onset seizure with history of carotid disease and stroke. I would favor MRI to rule out recurrent carotid disease, but if stable, then can continue keppra. EEG could be done as outpatient.   Ritta SlotMcNeill Tessi Eustache, MD [facility] [phone]  If 7pm- 7am, please page neurology on call as listed in [facility]ION.

## 2016-07-11 NOTE — Progress Notes (Signed)
Patient is requesting removal of stiches in left thumb from an injury on 07/02/16. Provider notified.

## 2017-01-08 ENCOUNTER — Emergency Department (HOSPITAL_COMMUNITY)
Admission: EM | Admit: 2017-01-08 | Discharge: 2017-01-08 | Disposition: A | Discharge status: Home / Self Care | Payer: Managed Care, Other (non HMO) | Attending: Emergency Medicine | Admitting: Emergency Medicine

## 2017-01-08 ENCOUNTER — Emergency Department (HOSPITAL_COMMUNITY): Payer: Managed Care, Other (non HMO)

## 2017-01-08 DIAGNOSIS — Y929 Unspecified place or not applicable: Secondary | ICD-10-CM | POA: Diagnosis not present

## 2017-01-08 DIAGNOSIS — T148XXA Other injury of unspecified body region, initial encounter: Secondary | ICD-10-CM | POA: Diagnosis not present

## 2017-01-08 DIAGNOSIS — R1031 Right lower quadrant pain: Secondary | ICD-10-CM | POA: Diagnosis not present

## 2017-01-08 DIAGNOSIS — R300 Dysuria: Secondary | ICD-10-CM | POA: Diagnosis present

## 2017-01-08 DIAGNOSIS — Y999 Unspecified external cause status: Secondary | ICD-10-CM | POA: Diagnosis not present

## 2017-01-08 DIAGNOSIS — Y33XXXA Other specified events, undetermined intent, initial encounter: Secondary | ICD-10-CM | POA: Diagnosis not present

## 2017-01-08 DIAGNOSIS — Y939 Activity, unspecified: Secondary | ICD-10-CM | POA: Diagnosis not present

## 2017-01-08 DIAGNOSIS — I10 Essential (primary) hypertension: Secondary | ICD-10-CM | POA: Diagnosis not present

## 2017-01-08 DIAGNOSIS — Z8673 Personal history of transient ischemic attack (TIA), and cerebral infarction without residual deficits: Secondary | ICD-10-CM | POA: Insufficient documentation

## 2017-01-08 DIAGNOSIS — Z79899 Other long term (current) drug therapy: Secondary | ICD-10-CM | POA: Diagnosis not present

## 2017-01-08 DIAGNOSIS — M545 Low back pain, unspecified: Secondary | ICD-10-CM

## 2017-01-08 LAB — URINALYSIS, ROUTINE W REFLEX MICROSCOPIC
Bacteria, UA: NONE SEEN
Glucose, UA: NEGATIVE mg/dL
Hgb urine dipstick: NEGATIVE
Ketones, ur: NEGATIVE mg/dL
Leukocytes, UA: NEGATIVE
Nitrite: NEGATIVE
PROTEIN: 30 mg/dL — AB
SPECIFIC GRAVITY, URINE: 1.038 — AB (ref 1.005–1.030)
pH: 5 (ref 5.0–8.0)

## 2017-01-08 LAB — CBC WITH DIFFERENTIAL/PLATELET
BASOS ABS: 0.1 10*3/uL (ref 0.0–0.1)
Basophils Relative: 1 %
Eosinophils Absolute: 0.3 10*3/uL (ref 0.0–0.7)
Eosinophils Relative: 2 %
HEMATOCRIT: 42 % (ref 39.0–52.0)
HEMOGLOBIN: 15.2 g/dL (ref 13.0–17.0)
LYMPHS PCT: 28 %
Lymphs Abs: 3.6 10*3/uL (ref 0.7–4.0)
MCH: 30.6 pg (ref 26.0–34.0)
MCHC: 36.2 g/dL — ABNORMAL HIGH (ref 30.0–36.0)
MCV: 84.5 fL (ref 78.0–100.0)
Monocytes Absolute: 1.2 10*3/uL — ABNORMAL HIGH (ref 0.1–1.0)
Monocytes Relative: 9 %
NEUTROS ABS: 7.8 10*3/uL — AB (ref 1.7–7.7)
Neutrophils Relative %: 60 %
Platelets: 279 10*3/uL (ref 150–400)
RBC: 4.97 MIL/uL (ref 4.22–5.81)
RDW: 12.8 % (ref 11.5–15.5)
WBC: 13 10*3/uL — AB (ref 4.0–10.5)

## 2017-01-08 LAB — BASIC METABOLIC PANEL
ANION GAP: 9 (ref 5–15)
BUN: 13 mg/dL (ref 6–20)
CHLORIDE: 100 mmol/L — AB (ref 101–111)
CO2: 27 mmol/L (ref 22–32)
Calcium: 8.9 mg/dL (ref 8.9–10.3)
Creatinine, Ser: 1.05 mg/dL (ref 0.61–1.24)
GFR calc Af Amer: >60 mL/min (ref >60)
GLUCOSE: 97 mg/dL (ref 65–99)
POTASSIUM: 3.5 mmol/L (ref 3.5–5.1)
Sodium: 136 mmol/L (ref 135–145)

## 2017-01-08 MED ORDER — CYCLOBENZAPRINE HCL 10 MG PO TABS: 10.0000 mg | ORAL_TABLET | Freq: Two times a day (BID) | ORAL | 0 refills | Status: DC | PRN

## 2017-01-08 MED ORDER — SODIUM CHLORIDE 0.9 % IV BOLUS (SEPSIS): 1000.0000 mL | Freq: Once | INTRAVENOUS | Status: DC

## 2017-01-08 MED ORDER — KETOROLAC TROMETHAMINE 30 MG/ML IJ SOLN
30.0000 mg | Freq: Once | INTRAMUSCULAR | Status: AC
  Administered 2017-01-08: 30 mg via INTRAVENOUS
  Filled 2017-01-08: qty 1

## 2017-01-08 NOTE — Discharge Instructions (Signed)
You can take Tylenol or Ibuprofen as directed for pain. Do not take any more ibuprofen tonight as the medication we gave you has ibuprofen in.  Take Flexeril as prescribed. This medication will make you drowsy so do not drive or drink alcohol when taking it.  Apply heat to the effected area.  Refrain from heavy lifting for the next week.   Follow-up with your primary care doctor in the next 2-4 days. If you do not have any improvement in symptoms in the next few days, either follow-up with your primary care doctor or return to the Emergency Department.   Return the emergency Department for any worsening pain, fever, abdominal pain, nausea/vomiting, difficulty urinating, difficulty walking, numbness/weakness of her arms and legs or any other worsening or concerning symptoms.

## 2017-01-08 NOTE — ED Provider Notes (Signed)
[facility]-EMERGENCY DEPT Provider Note   CSN: [id number] Arrival date & time: 01/08/17  1549     History   Chief Complaint Chief Complaint  Patient presents with  . Dysuria    HPI Edwin Roberson is a 36 y.o. male who presents with progressively worsening lower back pain and difficulty/discomfort urinating for the last week. Patient reports that approximately one week ago he started experiencing some diffuse bilateral lower back pain. He reports that pain was constant but easily improved with the use of ibuprofen. Pain was worsened with movement of torso and bending forward. Reports that in the last 2 days, pain has continued to progressively worsened. On ED arrival, pain is 7/10 and has localized more to the right flank/right back. Patient also reports that he initially was having some discomfort urinating but states that over the last few days he has had urinary frequency but will have difficulty urinating. Patient states that he will feel the urge to urinate but then will have difficulty starting his stream. Patient states that once he is able to start his flow, he has no issues urinating. He denies any hematuria, urinary or bowel incontinence. Patient states that he has been able to eat drink without any difficulty. He did have some mild nausea but took a previously prescribed Zofran with improvement. Patient states that he has recently been doing heavy lifting while at work. Patient denies any fevers, chills, chest pain, difficulty breathing, abdominal pain, vomiting. Denies fevers, weight loss, numbness/weakness of upper and lower extremities, bowel/bladder incontinence, saddle anesthesia, history of back surgery, history of IVDA.   The history is provided by the patient.    Past Medical History:  Diagnosis Date  . Anxiety   . Hypertension   . Migraine   . PCL injury     Patient Active Problem List   Diagnosis Date Noted  . Seizure (HCC) 07/10/2016  . Hypokalemia 07/10/2016  .  History of stroke 07/10/2016  . ADHD 07/10/2016  . Lateral epicondylitis of left elbow 07/10/2016  . Carotid artery disease (HCC) 04/08/2016  . Anxiety 04/08/2016  . Migraine headache 04/08/2016  . HTN (hypertension) 07/11/2012    Past Surgical History:  Procedure Laterality Date  . SHOULDER SURGERY Right 03/2014       Home Medications    Prior to Admission medications   Medication Sig Start Date End Date Taking? Authorizing Provider  acetaminophen (TYLENOL) 325 MG tablet Take 325-650 mg by mouth every 6 (six) hours as needed (pain or headaches).   Yes [provider]  ALPRAZolam Prudy Feeler) 0.5 MG tablet Take 0.5 mg by mouth 4 (four) times daily as needed for anxiety. 11/21/13  Yes [provider]  amphetamine-dextroamphetamine (ADDERALL XR) 30 MG 24 hr capsule Take 30 mg by mouth daily.   Yes [provider]  aspirin 325 MG tablet Take 1 tablet (325 mg total) by mouth daily. 04/09/16  Yes Ghimire, Werner Lean, MD  atorvastatin (LIPITOR) 80 MG tablet Take 80 mg by mouth every evening. 04/14/16  Yes [provider]  carvedilol (COREG) 6.25 MG tablet Take 6.25 mg by mouth 2 (two) times daily with a meal.   Yes [provider]  Cholecalciferol (VITAMIN D-1000 MAX ST) 1000 units tablet Take 1,000 Units by mouth daily.   Yes [provider]  clopidogrel (PLAVIX) 75 MG tablet Take 75 mg by mouth daily. 04/14/16  Yes [provider]  Coenzyme Q10 100 MG capsule Take 100 mg by mouth daily. 09/26/16  Yes [provider]  cyanocobalamin (,VITAMIN B-12,) 1000 MCG/ML injection Inject 1,000 mcg into the muscle daily. 07/27/16  Yes [provider]  DULoxetine (CYMBALTA) 60 MG capsule Take 60 mg by mouth daily. 05/21/16  Yes [provider]  levETIRAcetam (KEPPRA) 500 MG tablet Take 1 tablet (500 mg total) by mouth 2 (two) times daily. 07/11/16  Yes Dorothea Ogle, MD  losartan-hydrochlorothiazide (HYZAAR) 100-25 MG per  tablet Take 1 tablet by mouth daily. Reported on 06/12/2015   Yes [provider]  magnesium oxide (MAG-OX) 400 MG tablet Take 400 mg by mouth every evening. 04/22/16  Yes [provider]  promethazine (PHENERGAN) 25 MG tablet Take 50 mg by mouth daily as needed (for migraine-related nausea).  04/22/16  Yes [provider]  cyclobenzaprine (FLEXERIL) 10 MG tablet Take 1 tablet (10 mg total) by mouth 2 (two) times daily as needed for muscle spasms. 01/08/17   Maxwell Caul, PA-C    Family History Family History  Problem Relation Age of Onset  . Heart disease Father     Social History Social History  Substance Use Topics  . Smoking status: Never Smoker  . Smokeless tobacco: Never Used  . Alcohol use No     Comment: occ     Allergies   Gabapentin; Tramadol; and Valproic acid   Review of Systems Review of Systems  Constitutional: Negative for chills and fever.  Respiratory: Negative for shortness of breath.   Cardiovascular: Negative for chest pain.  Gastrointestinal: Positive for nausea. Negative for abdominal pain and vomiting.  Genitourinary: Positive for difficulty urinating, dysuria and flank pain. Negative for hematuria.  Musculoskeletal: Positive for back pain. Negative for neck pain.  Skin: Negative for rash.  Neurological: Negative for weakness, numbness and headaches.     Physical Exam Updated Vital Signs BP (!) 157/112   Pulse 87   Temp 97.8 F (36.6 C) (Oral)   Resp 18   SpO2 96%   Physical Exam  Constitutional: He is oriented to person, place, and time. He appears well-developed and well-nourished.  Standing in the room, appears uncomfortable but no acute distress   HENT:  Head: Normocephalic and atraumatic.  Mouth/Throat: Oropharynx is clear and moist and mucous membranes are normal.  Eyes: Pupils are equal, round, and reactive to light. Conjunctivae, EOM and lids are normal.  Neck: Full passive range of motion without pain.   Cardiovascular: Normal rate, regular rhythm, normal heart sounds and normal pulses.  Exam reveals no gallop and no friction rub.   No murmur heard. Pulmonary/Chest: Effort normal and breath sounds normal.  Abdominal: Soft. Normal appearance and bowel sounds are normal. There is no tenderness. There is CVA tenderness (right-sided). There is no rigidity, no guarding and no tenderness at McBurney's point.  Musculoskeletal: Normal range of motion.       Thoracic back: He exhibits no tenderness.       Lumbar back: He exhibits no tenderness.  Diffuse muscular tenderness to the right paraspinal muscles of the lumbar region. No midline tenderness. No deformities or crepitus.   Neurological: He is alert and oriented to person, place, and time.  Cranial nerves III-XII intact Follows commands, Moves all extremities  5/5 strength to BUE and BLE  Sensation intact throughout all major nerve distributions Normal finger to nose. No dysdiadochokinesia. No pronator drift. No gait abnormalities  No slurred speech. No facial droop.   Skin: Skin is warm and dry. Capillary refill takes less than 2 seconds.  Psychiatric: He  has a normal mood and affect. His speech is normal.  Nursing note and vitals reviewed.    ED Treatments / Results  Labs (all labs ordered are listed, but only abnormal results are displayed) Labs Reviewed  URINALYSIS, ROUTINE W REFLEX MICROSCOPIC - Abnormal; Notable for the following:       Result Value   Specific Gravity, Urine 1.038 (*)    Bilirubin Urine SMALL (*)    Protein, ur 30 (*)    Squamous Epithelial / LPF 0-5 (*)    All other components within normal limits  CBC WITH DIFFERENTIAL/PLATELET - Abnormal; Notable for the following:    WBC 13.0 (*)    MCHC 36.2 (*)    Neutro Abs 7.8 (*)    Monocytes Absolute 1.2 (*)    All other components within normal limits  BASIC METABOLIC PANEL - Abnormal; Notable for the following:    Chloride 100 (*)    All other components  within normal limits    EKG  EKG Interpretation None       Radiology Ct Renal Stone Study  Result Date: 01/08/2017 CLINICAL DATA:  36 year old male with acute bilateral flank and abdominal pain for 2 days. EXAM: CT ABDOMEN AND PELVIS WITHOUT CONTRAST TECHNIQUE: Multidetector CT imaging of the abdomen and pelvis was performed following the standard protocol without IV contrast. COMPARISON:  None. FINDINGS: Please note that parenchymal abnormalities may be missed without intravenous contrast. Lower chest: No acute abnormality Hepatobiliary: Hepatic steatosis noted without focal hepatic lesion. The gallbladder is unremarkable. No biliary dilatation. Pancreas: Unremarkable Spleen: Unremarkable Adrenals/Urinary Tract: The kidneys and adrenal glands are unremarkable. No evidence of hydronephrosis or urinary calculi. Apparent mild circumferential bladder wall thickening may represent cystitis/ infection. Stomach/Bowel: Stomach is within normal limits. Appendix appears normal. No evidence of bowel wall thickening, distention, or inflammatory changes. Vascular/Lymphatic: No significant vascular findings are present. No enlarged abdominal or pelvic lymph nodes. Reproductive: Prostate is unremarkable. Other: No ascites, focal collection or pneumoperitoneum. Musculoskeletal: No acute or significant osseous findings. IMPRESSION: 1. Apparent mild circumferential bladder wall thickening which may represent cystitis. Correlate clinically. 2. Hepatic steatosis Electronically Signed   By: Harmon Pier M.D.   On: 01/08/2017 20:38    Procedures Procedures (including critical care time)  Medications Ordered in ED Medications  ketorolac (TORADOL) 30 MG/ML injection 30 mg (30 mg Intravenous Given 01/08/17 2204)     Initial Impression / Assessment and Plan / ED Course  I have reviewed the triage vital signs and the nursing notes.  Pertinent labs & imaging results that were available during my care of the patient  were reviewed by me and considered in my medical decision making (see chart for details).     36 year old male who presents with 1 week of progressively worsening lower back pain. Initially started out as bilaterally, has since localized to the right flank/right paraspinal muscles. Associated with some dysuria, discomfort with urination, difficulty urinating. No urinary retention. No fever, chills, difficulty in relating, numbness/weakness of arms or legs. No red flag symptoms on exam. No neuro deficits on exam. Consider musculoskeletal back pain vs kidney stone vs pyelonephritis vs UTI. History/physical exam are not concerning for spinal abscess or cauda equina. Will plan to check basic labs including CBC, BMP, UA. Will also check CT renal stone scan for evaluation of potential kidney stone. Analgesics provided in the department. IVF given for fluid resuscitation.   Labs reviewed. CBC shows my leukocytosis but otherwise normal. BMP unremarkable. UA negative for any  acute signs of infection. CT pending.   CT renal stone scan negative for any kidney stones. Discussed results with patient. He has had some improvement in pain since pain medication in the department. He has been able to ambulate in the department without difficulty. Repeat vitals show that patient is still hypertensive but patient has not taken his HTN medications. Instructed him to take his medications once getting home. Conservative therapies discussed. Instructed patient to follow-up with PCP in 2 days. Strict return precautions discussed. Patient expresses understanding and agreement to plan.     Final Clinical Impressions(s) / ED Diagnoses   Final diagnoses:  Acute right-sided low back pain without sciatica  Muscle strain    New Prescriptions Discharge Medication List as of 01/08/2017 10:23 PM    START taking these medications   Details  cyclobenzaprine (FLEXERIL) 10 MG tablet Take 1 tablet (10 mg total) by mouth 2 (two)  times daily as needed for muscle spasms., Starting Fri 01/08/2017, Print         Maxwell CaulLayden, Madelaine Whipple A, PA-C 01/10/17 1434    Cathren LaineSteinl, Kevin, MD 01/10/17 [phone]

## 2017-01-08 NOTE — ED Triage Notes (Signed)
Patient presents with bilateral lower back pain radiating to flank. Patient complains of dysuria. States pain has occurred for the last 2-3 days.

## 2017-02-21 ENCOUNTER — Emergency Department (HOSPITAL_COMMUNITY)
Admission: EM | Admit: 2017-02-21 | Discharge: 2017-02-21 | Disposition: A | Discharge status: Home / Self Care | Payer: Managed Care, Other (non HMO) | Attending: Emergency Medicine | Admitting: Emergency Medicine

## 2017-02-21 ENCOUNTER — Encounter (HOSPITAL_COMMUNITY): Payer: Self-pay

## 2017-02-21 DIAGNOSIS — Z7982 Long term (current) use of aspirin: Secondary | ICD-10-CM | POA: Diagnosis not present

## 2017-02-21 DIAGNOSIS — I10 Essential (primary) hypertension: Secondary | ICD-10-CM | POA: Diagnosis not present

## 2017-02-21 DIAGNOSIS — X500XXA Overexertion from strenuous movement or load, initial encounter: Secondary | ICD-10-CM | POA: Diagnosis not present

## 2017-02-21 DIAGNOSIS — M545 Low back pain: Secondary | ICD-10-CM | POA: Diagnosis present

## 2017-02-21 DIAGNOSIS — Z7902 Long term (current) use of antithrombotics/antiplatelets: Secondary | ICD-10-CM | POA: Insufficient documentation

## 2017-02-21 DIAGNOSIS — Z79899 Other long term (current) drug therapy: Secondary | ICD-10-CM | POA: Insufficient documentation

## 2017-02-21 DIAGNOSIS — M62830 Muscle spasm of back: Secondary | ICD-10-CM | POA: Insufficient documentation

## 2017-02-21 HISTORY — DX: Cerebral infarction, unspecified: I63.9

## 2017-02-21 LAB — URINALYSIS, ROUTINE W REFLEX MICROSCOPIC
BILIRUBIN URINE: NEGATIVE
Glucose, UA: NEGATIVE mg/dL
Hgb urine dipstick: NEGATIVE
KETONES UR: NEGATIVE mg/dL
LEUKOCYTES UA: NEGATIVE
NITRITE: NEGATIVE
PH: 6 (ref 5.0–8.0)
PROTEIN: NEGATIVE mg/dL
Specific Gravity, Urine: 1.009 (ref 1.005–1.030)

## 2017-02-21 MED ORDER — PREDNISONE 10 MG PO TABS: 20.0000 mg | ORAL_TABLET | Freq: Two times a day (BID) | ORAL | 0 refills | Status: AC

## 2017-02-21 MED ORDER — METHOCARBAMOL 500 MG PO TABS: 500.0000 mg | ORAL_TABLET | Freq: Two times a day (BID) | ORAL | 0 refills | Status: AC

## 2017-02-21 MED ORDER — CYCLOBENZAPRINE HCL 10 MG PO TABS
10.0000 mg | ORAL_TABLET | Freq: Once | ORAL | Status: AC
  Administered 2017-02-21: 10 mg via ORAL
  Filled 2017-02-21: qty 1

## 2017-02-21 NOTE — ED Provider Notes (Signed)
[facility]-EMERGENCY DEPT Provider Note   CSN: [id number] Arrival date & time: 02/21/17  1451     History   Chief Complaint Chief Complaint  Patient presents with  . Back Pain    HPI Edwin Roberson is a 36 y.o. male who presents to the ED with low back pain. The pain started back in August and the patient was evaluated here at that time and treated for pain and muscle spasm. Patient reports that he does a lot of lifting at work and wears a back brace. He noted one day at work that while lifting his back was hurting. The pain goes away for a while while resting the area but then returns.   HPI  Past Medical History:  Diagnosis Date  . Anxiety   . Hypertension   . Migraine   . PCL injury   . Stroke [facility])     Patient Active Problem List   Diagnosis Date Noted  . Seizure (HCC) 07/10/2016  . Hypokalemia 07/10/2016  . History of stroke 07/10/2016  . ADHD 07/10/2016  . Lateral epicondylitis of left elbow 07/10/2016  . Carotid artery disease (HCC) 04/08/2016  . Anxiety 04/08/2016  . Migraine headache 04/08/2016  . HTN (hypertension) 07/11/2012    Past Surgical History:  Procedure Laterality Date  . carotic procedure    . SHOULDER SURGERY Right 03/2014       Home Medications    Prior to Admission medications   Medication Sig Start Date End Date Taking? Authorizing Provider  acetaminophen (TYLENOL) 325 MG tablet Take 325-650 mg by mouth every 6 (six) hours as needed (pain or headaches).    [provider]  ALPRAZolam Prudy Feeler) 0.5 MG tablet Take 0.5 mg by mouth 4 (four) times daily as needed for anxiety. 11/21/13   [provider]  amphetamine-dextroamphetamine (ADDERALL XR) 30 MG 24 hr capsule Take 30 mg by mouth daily.    [provider]  aspirin 325 MG tablet Take 1 tablet (325 mg total) by mouth daily. 04/09/16   Ghimire, Werner Lean, MD  atorvastatin (LIPITOR) 80 MG tablet Take 80 mg by mouth every evening. 04/14/16   [provider]    carvedilol (COREG) 6.25 MG tablet Take 6.25 mg by mouth 2 (two) times daily with a meal.    [provider]  Cholecalciferol (VITAMIN D-1000 MAX ST) 1000 units tablet Take 1,000 Units by mouth daily.    [provider]  clopidogrel (PLAVIX) 75 MG tablet Take 75 mg by mouth daily. 04/14/16   [provider]  Coenzyme Q10 100 MG capsule Take 100 mg by mouth daily. 09/26/16   [provider]  cyanocobalamin (,VITAMIN B-12,) 1000 MCG/ML injection Inject 1,000 mcg into the muscle daily. 07/27/16   [provider]  DULoxetine (CYMBALTA) 60 MG capsule Take 60 mg by mouth daily. 05/21/16   [provider]  levETIRAcetam (KEPPRA) 500 MG tablet Take 1 tablet (500 mg total) by mouth 2 (two) times daily. 07/11/16   Dorothea Ogle, MD  losartan-hydrochlorothiazide (HYZAAR) 100-25 MG per tablet Take 1 tablet by mouth daily. Reported on 06/12/2015    [provider]  magnesium oxide (MAG-OX) 400 MG tablet Take 400 mg by mouth every evening. 04/22/16   [provider]  methocarbamol (ROBAXIN) 500 MG tablet Take 1 tablet (500 mg total) by mouth 2 (two) times daily. 02/21/17   Janne Napoleon, NP  predniSONE (DELTASONE) 10 MG tablet Take 2 tablets (20 mg total) by mouth 2 (two)  times daily with a meal. 02/21/17   Mehar Kirkwood, [place], NP  promethazine (PHENERGAN) 25 MG tablet Take 50 mg by mouth daily as needed (for migraine-related nausea).  04/22/16   [provider]    Family History Family History  Problem Relation Age of Onset  . Heart disease Father     Social History Social History  Substance Use Topics  . Smoking status: Never Smoker  . Smokeless tobacco: Never Used  . Alcohol use No     Comment: occ     Allergies   Gabapentin; Tramadol; and Valproic acid   Review of Systems Review of Systems  Constitutional: Negative for chills and fever.  HENT: Negative.   Gastrointestinal: Negative for nausea and vomiting.   Genitourinary: Negative for dysuria and urgency.  Musculoskeletal: Positive for back pain.     Physical Exam Updated Vital Signs BP (!) 166/103 (BP Location: Right Arm)   Pulse 97   Temp 97.6 F (36.4 C) (Oral)   Resp 16   Ht  (1.676 m)   Wt 90.7 kg (200 lb)   SpO2 99%   BMI 32.28 kg/m   Physical Exam  Constitutional: He appears well-developed and well-nourished. No distress.  HENT:  Head: Normocephalic and atraumatic.  Eyes: Conjunctivae and EOM are normal.  Neck: Normal range of motion. Neck supple.  Cardiovascular: Normal rate and regular rhythm.   Pulmonary/Chest: Effort normal and breath sounds normal.  Musculoskeletal: Normal range of motion. He exhibits no edema.       Lumbar back: He exhibits tenderness. He exhibits normal range of motion, no deformity, no spasm and normal pulse.  Neurological: He is alert. He has normal strength. No sensory deficit. Gait normal.  Reflex Scores:      Bicep reflexes are 2+ on the right side and 2+ on the left side.      Brachioradialis reflexes are 2+ on the right side and 2+ on the left side.      Patellar reflexes are 2+ on the right side and 2+ on the left side. Skin: Skin is warm and dry.  Psychiatric: He has a normal mood and affect. His behavior is normal.  Nursing note and vitals reviewed.    ED Treatments / Results  Labs (all labs ordered are listed, but only abnormal results are displayed) Labs Reviewed  URINALYSIS, ROUTINE W REFLEX MICROSCOPIC    Radiology No results found.  Procedures Procedures (including critical care time)  Medications Ordered in ED Medications  cyclobenzaprine (FLEXERIL) tablet 10 mg (10 mg Oral Given 02/21/17 1841)     Initial Impression / Assessment and Plan / ED Course  I have reviewed the triage vital signs and the nursing notes. 36 y.o. male with low back pain that has been off and on for the past few months stable for d/c without neuro deficits. Will treat for pain and  muscle spasm and patient to f/u with ortho.   Final Clinical Impressions(s) / ED Diagnoses   Final diagnoses:  Spasm of muscle of lower back    New Prescriptions Discharge Medication List as of 02/21/2017  7:03 PM    START taking these medications   Details  methocarbamol (ROBAXIN) 500 MG tablet Take 1 tablet (500 mg total) by mouth 2 (two) times daily., Starting Sun 02/21/2017, Print    predniSONE (DELTASONE) 10 MG tablet Take 2 tablets (20 mg total) by mouth 2 (two) times daily with a meal., Starting Sun 02/21/2017, Print  Kerrie [place], [place] 02/21/17 2303    Samuel Jester, DO 02/21/17 2315

## 2017-02-21 NOTE — Discharge Instructions (Signed)
The muscle relaxer can make you sleepy so do not take while driving. Follow up with Dr. Aundria Rud, call for appointment. Return here as needed.

## 2017-02-21 NOTE — ED Triage Notes (Signed)
Patient states he was seen recently for low back pain, but the pain never went away. Patient states he was told to come back if the pain did not go away. Patient states, "My job is a lot of lifting and I feel like my lower back is locking up."

## 2017-08-11 ENCOUNTER — Emergency Department (HOSPITAL_COMMUNITY)
Admission: EM | Admit: 2017-08-11 | Discharge: 2017-08-11 | Disposition: A | Discharge status: Home / Self Care | Payer: Managed Care, Other (non HMO) | Attending: Emergency Medicine | Admitting: Emergency Medicine

## 2017-08-11 ENCOUNTER — Other Ambulatory Visit: Payer: Self-pay

## 2017-08-11 ENCOUNTER — Encounter (HOSPITAL_COMMUNITY): Payer: Self-pay | Admitting: *Deleted

## 2017-08-11 ENCOUNTER — Emergency Department (HOSPITAL_COMMUNITY): Payer: Managed Care, Other (non HMO)

## 2017-08-11 DIAGNOSIS — Z79899 Other long term (current) drug therapy: Secondary | ICD-10-CM | POA: Diagnosis not present

## 2017-08-11 DIAGNOSIS — Z8673 Personal history of transient ischemic attack (TIA), and cerebral infarction without residual deficits: Secondary | ICD-10-CM | POA: Insufficient documentation

## 2017-08-11 DIAGNOSIS — R079 Chest pain, unspecified: Secondary | ICD-10-CM

## 2017-08-11 DIAGNOSIS — I10 Essential (primary) hypertension: Secondary | ICD-10-CM | POA: Insufficient documentation

## 2017-08-11 DIAGNOSIS — R0789 Other chest pain: Secondary | ICD-10-CM | POA: Diagnosis present

## 2017-08-11 LAB — CBC
HCT: 43.4 % (ref 39.0–52.0)
HEMOGLOBIN: 14.8 g/dL (ref 13.0–17.0)
MCH: 30.5 pg (ref 26.0–34.0)
MCHC: 34.1 g/dL (ref 30.0–36.0)
MCV: 89.3 fL (ref 78.0–100.0)
Platelets: 332 10*3/uL (ref 150–400)
RBC: 4.86 MIL/uL (ref 4.22–5.81)
RDW: 13 % (ref 11.5–15.5)
WBC: 13.3 10*3/uL — AB (ref 4.0–10.5)

## 2017-08-11 LAB — TROPONIN I: Troponin I: <0.03 ng/mL (ref <0.03)

## 2017-08-11 LAB — I-STAT TROPONIN, ED: Troponin i, poc: 0 ng/mL (ref 0.00–0.08)

## 2017-08-11 LAB — BASIC METABOLIC PANEL
ANION GAP: 10 (ref 5–15)
BUN: 8 mg/dL (ref 6–20)
CALCIUM: 9 mg/dL (ref 8.9–10.3)
CHLORIDE: 101 mmol/L (ref 101–111)
CO2: 25 mmol/L (ref 22–32)
Creatinine, Ser: 0.88 mg/dL (ref 0.61–1.24)
GFR calc Af Amer: >60 mL/min (ref >60)
GFR calc non Af Amer: >60 mL/min (ref >60)
GLUCOSE: 94 mg/dL (ref 65–99)
Potassium: 3.6 mmol/L (ref 3.5–5.1)
Sodium: 136 mmol/L (ref 135–145)

## 2017-08-11 NOTE — ED Triage Notes (Signed)
Pt in c/o episode of chest pain this morning, resolved at this time, pain was to general chest area, unable to pinpoint, no distress at this time

## 2017-08-11 NOTE — ED Provider Notes (Signed)
[facility] EMERGENCY DEPARTMENT Provider Note   CSN: [id number] Arrival date & time: 08/11/17  1300     History   Chief Complaint Chief Complaint  Patient presents with  . Chest Pain    HPI Edwin Roberson is a 37 y.o. male.  HPI   37 year old male with history of hypertension, anxiety, ADHD, migraines, carotid artery stenosis, ischemic CVA presents today with complaints of chest pain.  Patient reports that this morning around 1030 a.m. approximately 2 hours prior to arrival he had an episode of acute chest pain.  Patient notes this was central in location and is having difficulty describing it.  Patient reports it somewhat feels like a squeezing sensation with no radiation no associated nausea vomiting diaphoresis or shortness of breath.  Patient denies any indigestion.  He notes it lasted approximately an hour and has now resolved.  Patient reports he is asymptomatic at this time.  Patient denies any history of the same, reports that he has had no chest pain with ambulation or exertion at baseline, this pain was not worsened with exertion today.  Patient denies any personal cardiac history but does note a history of CAD in his family, also notes a history of carotid artery stenosis.  Patient notes history of CVA as well.   Past Medical History:  Diagnosis Date  . Anxiety   . Hypertension   . Migraine   . PCL injury   . Stroke [facility])     Patient Active Problem List   Diagnosis Date Noted  . Seizure (HCC) 07/10/2016  . Hypokalemia 07/10/2016  . History of stroke 07/10/2016  . ADHD 07/10/2016  . Lateral epicondylitis of left elbow 07/10/2016  . Carotid artery disease (HCC) 04/08/2016  . Anxiety 04/08/2016  . Migraine headache 04/08/2016  . HTN (hypertension) 07/11/2012    Past Surgical History:  Procedure Laterality Date  . carotic procedure    . SHOULDER SURGERY Right 03/2014        Home Medications    Prior to Admission medications     Medication Sig Start Date End Date Taking? Authorizing Provider  acetaminophen (TYLENOL) 325 MG tablet Take 325-650 mg by mouth every 6 (six) hours as needed (pain or headaches).   Yes [provider]  ALPRAZolam Prudy Feeler) 0.5 MG tablet Take 0.5 mg by mouth 2 (two) times daily as needed for anxiety.  11/21/13  Yes [provider]  amphetamine-dextroamphetamine (ADDERALL XR) 30 MG 24 hr capsule Take 30 mg by mouth daily.   Yes [provider]  aspirin 325 MG tablet Take 1 tablet (325 mg total) by mouth daily. 04/09/16  Yes Ghimire, Werner Lean, MD  atorvastatin (LIPITOR) 80 MG tablet Take 80 mg by mouth every evening. 04/14/16  Yes [provider]  carvedilol (COREG) 6.25 MG tablet Take 6.25 mg by mouth 2 (two) times daily with a meal.   Yes [provider]  Cholecalciferol (VITAMIN D-1000 MAX ST) 1000 units tablet Take 1,000 Units by mouth daily.   Yes [provider]  clopidogrel (PLAVIX) 75 MG tablet Take 75 mg by mouth daily. 04/14/16  Yes [provider]  Coenzyme Q10 100 MG capsule Take 100 mg by mouth daily. 09/26/16  Yes [provider]  DULoxetine (CYMBALTA) 60 MG capsule Take 60 mg by mouth daily. 05/21/16  Yes [provider]  levETIRAcetam (KEPPRA) 750 MG tablet Take 750 mg by mouth 2 (two) times daily.   Yes [provider]  losartan-hydrochlorothiazide (HYZAAR) 100-25  MG per tablet Take 1 tablet by mouth daily. Reported on 06/12/2015   Yes [provider]  magnesium oxide (MAG-OX) 400 MG tablet Take 400 mg by mouth every evening. 04/22/16  Yes [provider]  promethazine (PHENERGAN) 25 MG tablet Take 50 mg by mouth daily as needed (for migraine-related nausea).  04/22/16  Yes [provider]  levETIRAcetam (KEPPRA) 500 MG tablet Take 1 tablet (500 mg total) by mouth 2 (two) times daily. Patient not taking: Reported on 08/11/2017 07/11/16   Dorothea Ogle, MD  methocarbamol (ROBAXIN)  500 MG tablet Take 1 tablet (500 mg total) by mouth 2 (two) times daily. Patient not taking: Reported on 08/11/2017 02/21/17   Janne Napoleon, NP  predniSONE (DELTASONE) 10 MG tablet Take 2 tablets (20 mg total) by mouth 2 (two) times daily with a meal. Patient not taking: Reported on 08/11/2017 02/21/17   Janne Napoleon, NP    Family History Family History  Problem Relation Age of Onset  . Heart disease Father     Social History Social History   Tobacco Use  . Smoking status: Never Smoker  . Smokeless tobacco: Never Used  Substance Use Topics  . Alcohol use: No    Comment: occ  . Drug use: No     Allergies   Gabapentin; Tramadol; and Valproic acid   Review of Systems Review of Systems  All other systems reviewed and are negative.    Physical Exam Updated Vital Signs BP (!) 141/99 (BP Location: Right Arm)   Pulse 95   Temp 97.8 F (36.6 C) (Oral)   Resp 18   SpO2 96%   Physical Exam  Constitutional: He is oriented to person, place, and time. He appears well-developed and well-nourished.  HENT:  Head: Normocephalic and atraumatic.  Eyes: Pupils are equal, round, and reactive to light. Conjunctivae are normal. Right eye exhibits no discharge. Left eye exhibits no discharge. No scleral icterus.  Neck: Normal range of motion. No JVD present. No tracheal deviation present.  Cardiovascular: Normal rate, regular rhythm, normal heart sounds and intact distal pulses. Exam reveals no gallop and no friction rub.  No murmur heard. Pulmonary/Chest: Effort normal and breath sounds normal. No stridor. No respiratory distress. He has no wheezes. He has no rales. He exhibits no tenderness.  Musculoskeletal: He exhibits no edema.  Neurological: He is alert and oriented to person, place, and time. Coordination normal.  Psychiatric: He has a normal mood and affect. His behavior is normal. Judgment and thought content normal.  Nursing note and vitals reviewed.   ED Treatments /  Results  Labs (all labs ordered are listed, but only abnormal results are displayed) Labs Reviewed  CBC - Abnormal; Notable for the following components:      Result Value   WBC 13.3 (*)    All other components within normal limits  BASIC METABOLIC PANEL  TROPONIN I  I-STAT TROPONIN, ED    EKG EKG Interpretation  Date/Time:  Wednesday August 11 2017 13:08:54 EDT Ventricular Rate:  95 PR Interval:  136 QRS Duration: 88 QT Interval:  342 QTC Calculation: 429 R Axis:   74 Text Interpretation:  Normal sinus rhythm no acute ST/T changes no significant change since mar 2018 Confirmed by Pricilla Loveless [phone]) on 08/11/2017 6:18:46 PM   Radiology Dg Chest 2 View  Result Date: 08/11/2017 CLINICAL DATA:  Chest pain beginning today with some associated dizziness. EXAM: CHEST - 2 VIEW COMPARISON:  12/09/2013 FINDINGS: Heart size  is normal. Mediastinal shadows are normal. Lungs are clear. The vascularity is normal. No effusions. Mild spinal curvature. IMPRESSION: Normal chest, with the exception of mild spinal curvature. Electronically Signed   By: Paulina FusiMark  Shogry M.D.   On: 08/11/2017 15:29    Procedures Procedures (including critical care time)  Medications Ordered in ED Medications - No data to display   Initial Impression / Assessment and Plan / ED Course  I have reviewed the triage vital signs and the nursing notes.  Pertinent labs & imaging results that were available during my care of the patient were reviewed by me and considered in my medical decision making (see chart for details).     Final Clinical Impressions(s) / ED Diagnoses   Final diagnoses:  Nonspecific chest pain    Labs: Troponin, BMP, CBC, i-STAT troponin  Imaging: DG chest 2 view  Consults:  Therapeutics:  Discharge Meds:   Assessment/Plan: 37 year old male presents today with complaints of chest pain.  This is very nonspecific he is very well-appearing in no acute distress he is asymptomatic at the  time of my evaluation.  His chest pain was nonexertional.  Patient does have a history of carotid stenosis but no diagnosis of CAD.  Patient had delta troponin here, remained asymptomatic through his evaluation.  Uncertain etiology of chest pain at this time although slightly lower suspicion for ACS, low suspicion for dissection or PE.  Patient will follow-up outpatient with cardiology he is instructed to return immediately with any new or worsening signs or symptoms.  He verbalized understanding and agreement to today's plan had no further questions or concerns at time of discharge.      ED Discharge Orders    None       Rosalio LoudHedges, Wendel Homeyer, PA-C 08/11/17 2118    Pricilla LovelessGoldston, [place], MD 08/12/17 [phone]

## 2017-08-11 NOTE — Discharge Instructions (Addendum)
Please read attached information. If you experience any new or worsening signs or symptoms please return to the emergency room for evaluation. Please follow-up with your primary care provider or specialist as discussed.  °

## 2017-08-11 NOTE — ED Notes (Signed)
Patient ambulated to restroom without difficulty.

## 2017-09-27 ENCOUNTER — Encounter (HOSPITAL_COMMUNITY): Payer: Self-pay

## 2017-09-27 ENCOUNTER — Emergency Department (HOSPITAL_COMMUNITY)
Admission: EM | Admit: 2017-09-27 | Discharge: 2017-09-27 | Disposition: A | Discharge status: Home / Self Care | Payer: Managed Care, Other (non HMO) | Attending: Emergency Medicine | Admitting: Emergency Medicine

## 2017-09-27 ENCOUNTER — Other Ambulatory Visit: Payer: Self-pay

## 2017-09-27 DIAGNOSIS — I10 Essential (primary) hypertension: Secondary | ICD-10-CM | POA: Insufficient documentation

## 2017-09-27 DIAGNOSIS — Z8673 Personal history of transient ischemic attack (TIA), and cerebral infarction without residual deficits: Secondary | ICD-10-CM | POA: Insufficient documentation

## 2017-09-27 DIAGNOSIS — I2510 Atherosclerotic heart disease of native coronary artery without angina pectoris: Secondary | ICD-10-CM | POA: Insufficient documentation

## 2017-09-27 DIAGNOSIS — Z79899 Other long term (current) drug therapy: Secondary | ICD-10-CM | POA: Diagnosis not present

## 2017-09-27 DIAGNOSIS — R51 Headache: Secondary | ICD-10-CM | POA: Insufficient documentation

## 2017-09-27 DIAGNOSIS — Z7982 Long term (current) use of aspirin: Secondary | ICD-10-CM | POA: Diagnosis not present

## 2017-09-27 DIAGNOSIS — R519 Headache, unspecified: Secondary | ICD-10-CM

## 2017-09-27 DIAGNOSIS — F909 Attention-deficit hyperactivity disorder, unspecified type: Secondary | ICD-10-CM | POA: Insufficient documentation

## 2017-09-27 DIAGNOSIS — F419 Anxiety disorder, unspecified: Secondary | ICD-10-CM | POA: Diagnosis not present

## 2017-09-27 MED ORDER — DIPHENHYDRAMINE HCL 50 MG/ML IJ SOLN
12.5000 mg | Freq: Once | INTRAMUSCULAR | Status: AC
  Administered 2017-09-27: 12.5 mg via INTRAVENOUS
  Filled 2017-09-27: qty 1

## 2017-09-27 MED ORDER — SODIUM CHLORIDE 0.9 % IV SOLN
INTRAVENOUS | Status: DC
  Administered 2017-09-27: 10:00:00 via INTRAVENOUS

## 2017-09-27 MED ORDER — KETOROLAC TROMETHAMINE 30 MG/ML IJ SOLN
30.0000 mg | Freq: Once | INTRAMUSCULAR | Status: AC
  Administered 2017-09-27: 30 mg via INTRAVENOUS
  Filled 2017-09-27: qty 1

## 2017-09-27 MED ORDER — PROCHLORPERAZINE EDISYLATE 10 MG/2ML IJ SOLN
10.0000 mg | Freq: Once | INTRAMUSCULAR | Status: AC
  Administered 2017-09-27: 10 mg via INTRAVENOUS
  Filled 2017-09-27: qty 2

## 2017-09-27 NOTE — ED Provider Notes (Signed)
[facility]-EMERGENCY DEPT Provider Note   CSN: [id number] Arrival date & time: 09/27/17  3086     History   Chief Complaint Chief Complaint  Patient presents with  . Neck Pain  . Headache    HPI Edwin Roberson is a 37 y.o. male.  HPI Patient presents to the emergency room for evaluation of recurrent headaches.  Patient has a history of migraine headaches as well as a stroke in the past.  Patient also has history of carotid artery stenosis.  Patient has been having intermittent sharp headaches that are in the back of his head and neck.  Denies any numbness.  He denies any weakness.  He denies any fevers or chills.  Severity waxes and wanes.  At times its more sharp.  Patient had a similar episode earlier this month.  He went to [facility] and had events imaging including CT CTA of the brain and an MRI of the brain.  Patient states about 3 days ago he started having recurrent episodes.  He called his neurologist at [facility] but they were not able to see him today.  Patient is presenting for treatment of his headache and wonders what is causing these since it is different than his typical migraines. Past Medical History:  Diagnosis Date  . Anxiety   . Hypertension   . Migraine   . PCL injury   . Stroke [facility])     Patient Active Problem List   Diagnosis Date Noted  . Seizure (HCC) 07/10/2016  . Hypokalemia 07/10/2016  . History of stroke 07/10/2016  . ADHD 07/10/2016  . Lateral epicondylitis of left elbow 07/10/2016  . Carotid artery disease (HCC) 04/08/2016  . Anxiety 04/08/2016  . Migraine headache 04/08/2016  . HTN (hypertension) 07/11/2012    Past Surgical History:  Procedure Laterality Date  . carotic procedure    . SHOULDER SURGERY Right 03/2014        Home Medications    Prior to Admission medications   Medication Sig Start Date End Date Taking? Authorizing Provider  acetaminophen (TYLENOL) 325 MG tablet Take 325-650 mg by mouth  every 6 (six) hours as needed (pain or headaches).   Yes [provider]  ALPRAZolam Prudy Feeler) 0.5 MG tablet Take 0.5 mg by mouth 2 (two) times daily as needed for anxiety.  11/21/13  Yes [provider]  amphetamine-dextroamphetamine (ADDERALL XR) 30 MG 24 hr capsule Take 30 mg by mouth daily.   Yes [provider]  aspirin 325 MG tablet Take 1 tablet (325 mg total) by mouth daily. Patient taking differently: Take 325 mg by mouth daily as needed for headache.  04/09/16  Yes Ghimire, Werner Lean, MD  aspirin EC 81 MG tablet Take 81 mg by mouth daily.   Yes [provider]  atorvastatin (LIPITOR) 80 MG tablet Take 80 mg by mouth every evening. 04/14/16  Yes [provider]  carvedilol (COREG) 6.25 MG tablet Take 6.25 mg by mouth 2 (two) times daily with a meal.   Yes [provider]  Cholecalciferol (VITAMIN D-1000 MAX ST) 1000 units tablet Take 1,000 Units by mouth daily.   Yes [provider]  clopidogrel (PLAVIX) 75 MG tablet Take 75 mg by mouth daily. 04/14/16  Yes [provider]  Coenzyme Q10 100 MG capsule Take 100 mg by mouth daily. 09/26/16  Yes [provider]  DULoxetine (CYMBALTA) 30 MG capsule Take 30 mg by mouth daily. 08/09/17  Yes [provider]  hydrochlorothiazide (  HYDRODIURIL) 25 MG tablet Take 25 mg by mouth daily. 09/13/17  Yes [provider]  levETIRAcetam (KEPPRA) 750 MG tablet Take 750 mg by mouth 2 (two) times daily.   Yes [provider]  magnesium oxide (MAG-OX) 400 MG tablet Take 400 mg by mouth every evening. 04/22/16  Yes [provider]  olmesartan (BENICAR) 40 MG tablet Take 40 mg by mouth daily. 09/13/17  Yes [provider]  promethazine (PHENERGAN) 25 MG tablet Take 25 mg by mouth daily as needed (for migraine-related nausea).  04/22/16  Yes [provider]  levETIRAcetam (KEPPRA) 500 MG tablet Take 1 tablet (500 mg total) by mouth 2 (two) times  daily. Patient not taking: Reported on 08/11/2017 07/11/16   Dorothea Ogle, MD  methocarbamol (ROBAXIN) 500 MG tablet Take 1 tablet (500 mg total) by mouth 2 (two) times daily. Patient not taking: Reported on 08/11/2017 02/21/17   Janne Napoleon, NP  predniSONE (DELTASONE) 10 MG tablet Take 2 tablets (20 mg total) by mouth 2 (two) times daily with a meal. Patient not taking: Reported on 08/11/2017 02/21/17   Janne Napoleon, NP    Family History Family History  Problem Relation Age of Onset  . Heart disease Father     Social History Social History   Tobacco Use  . Smoking status: Never Smoker  . Smokeless tobacco: Never Used  Substance Use Topics  . Alcohol use: Not Currently    Frequency: Never  . Drug use: No     Allergies   Gabapentin; Tramadol; and Valproic acid   Review of Systems Review of Systems  All other systems reviewed and are negative.    Physical Exam Updated Vital Signs BP 136/89 (BP Location: Right Arm)   Pulse 99   Temp 97.8 F (36.6 C) (Oral)   Resp 18   Ht 1.702 m ( )   Wt 86.2 kg (190 lb)   SpO2 99%   BMI 29.76 kg/m   Physical Exam  Constitutional: He is oriented to person, place, and time. He appears well-developed and well-nourished. No distress.  HENT:  Head: Normocephalic and atraumatic.  Right Ear: External ear normal.  Left Ear: External ear normal.  Mouth/Throat: Oropharynx is clear and moist.  Eyes: Conjunctivae are normal. Right eye exhibits no discharge. Left eye exhibits no discharge. No scleral icterus.  Neck: Normal range of motion. Neck supple. No tracheal deviation present.  Cardiovascular: Normal rate, regular rhythm and intact distal pulses.  Pulmonary/Chest: Effort normal and breath sounds normal. No stridor. No respiratory distress. He has no wheezes. He has no rales.  Abdominal: Soft. Bowel sounds are normal. He exhibits no distension. There is no tenderness. There is no rebound and no guarding.  Musculoskeletal: He  exhibits no edema or tenderness.  Neurological: He is alert and oriented to person, place, and time. He has normal strength. No cranial nerve deficit (No facial droop, extraocular movements intact, tongue midline ) or sensory deficit. He exhibits normal muscle tone. He displays no seizure activity. Coordination normal.  No pronator drift bilateral upper extrem, able to hold both legs off bed for 5 seconds, sensation intact in all extremities, no visual field cuts, no left or right sided neglect, normal finger-nose exam bilaterally, no nystagmus noted   Skin: Skin is warm and dry. No rash noted.  Psychiatric: He has a normal mood and affect.  Nursing note and vitals reviewed.    ED Treatments / Results   Procedures Procedures (including critical  care time)  Medications Ordered in ED Medications  0.9 %  sodium chloride infusion ( Intravenous New Bag/Given 09/27/17 0944)  prochlorperazine (COMPAZINE) injection 10 mg (10 mg Intravenous Given 09/27/17 0944)  ketorolac (TORADOL) 30 MG/ML injection 30 mg (30 mg Intravenous Given 09/27/17 0944)  diphenhydrAMINE (BENADRYL) injection 12.5 mg (12.5 mg Intravenous Given 09/27/17 0944)     Initial Impression / Assessment and Plan / ED Course  I have reviewed the triage vital signs and the nursing notes.  Pertinent labs & imaging results that were available during my care of the patient were reviewed by me and considered in my medical decision making (see chart for details).  Clinical Course as of Sep 28 1111  Mon Sep 27, 2017  0909 Previous images reviewed.  Hx of 70% ica stenosis.   [JK]  0910 Pt also had a CTA and MRI at [facility] earlier this month.   [JK]  1110 Patient is feeling better after treatment.  Feels ready to leave   [JK]    Clinical Course User Index [JK] Linwood Dibbles, MD    Patient presented to ED for recurrent headaches.  Patient does have a complicated history of having a prior stroke as well as carotid stenosis.  Patient has  been having recurrent issues with headache.  He is not having any neurologic symptoms today.  He recently had reassuring advanced brain and neck imaging after an exacerbation earlier this month.  I doubt serious etiology such as stroke, meningitis, dissection, intracerebral hemorrhage.  Patient improved with treatment.  We discussed following up with his neurologist for further treatment.  Final Clinical Impressions(s) / ED Diagnoses   Final diagnoses:  Nonintractable episodic headache, unspecified headache type    ED Discharge Orders    None       Linwood Dibbles, MD 09/27/17 1113

## 2017-09-27 NOTE — Discharge Instructions (Addendum)
Continue your current medications, follow-up with your neurologist °

## 2017-09-27 NOTE — ED Triage Notes (Signed)
Patient c/o headache and posterior neck pain. Patient states he has "2 clogged arteries" in the back of his neck. Patient states he is trying to get an appointment with his neurologist at [facility], but is needing relief from his headache.

## 2018-04-02 ENCOUNTER — Other Ambulatory Visit: Payer: Self-pay

## 2018-04-02 ENCOUNTER — Encounter (HOSPITAL_COMMUNITY): Payer: Self-pay | Admitting: Obstetrics and Gynecology

## 2018-04-02 ENCOUNTER — Emergency Department (HOSPITAL_COMMUNITY): Payer: Managed Care, Other (non HMO)

## 2018-04-02 ENCOUNTER — Emergency Department (HOSPITAL_COMMUNITY)
Admission: EM | Admit: 2018-04-02 | Discharge: 2018-04-03 | Disposition: A | Discharge status: Home / Self Care | Payer: Managed Care, Other (non HMO) | Attending: Emergency Medicine | Admitting: Emergency Medicine

## 2018-04-02 DIAGNOSIS — M25512 Pain in left shoulder: Secondary | ICD-10-CM | POA: Diagnosis present

## 2018-04-02 DIAGNOSIS — Y92013 Bedroom of single-family (private) house as the place of occurrence of the external cause: Secondary | ICD-10-CM | POA: Diagnosis not present

## 2018-04-02 DIAGNOSIS — I10 Essential (primary) hypertension: Secondary | ICD-10-CM | POA: Insufficient documentation

## 2018-04-02 DIAGNOSIS — W06XXXA Fall from bed, initial encounter: Secondary | ICD-10-CM | POA: Diagnosis not present

## 2018-04-02 DIAGNOSIS — Z79899 Other long term (current) drug therapy: Secondary | ICD-10-CM | POA: Insufficient documentation

## 2018-04-02 DIAGNOSIS — M25552 Pain in left hip: Secondary | ICD-10-CM | POA: Diagnosis not present

## 2018-04-02 DIAGNOSIS — Y939 Activity, unspecified: Secondary | ICD-10-CM | POA: Insufficient documentation

## 2018-04-02 DIAGNOSIS — Y999 Unspecified external cause status: Secondary | ICD-10-CM | POA: Diagnosis not present

## 2018-04-02 NOTE — ED Provider Notes (Signed)
[facility]-EMERGENCY DEPT Provider Note   CSN: [id number] Arrival date & time: 04/02/18  1803     History   Chief Complaint Chief Complaint  Patient presents with  . Fall  . Body Laceration    HPI Edwin Roberson is a 37 y.o. male.  The history is provided by the patient and medical records.  Fall      37 y.o. M with hx of anxiety, HTN, migraine headaches, prior stroke, presenting to the ED following a fall.  Patient reports he rolled out of his bed and hit his left shoulder against the nightstand, then fall onto floor on left side.  Denies head injury or LOC.  States he has pain in his left ribs and right hip, mostly the hip.  He denies numbness/weakness.  Does have increased pain in the ribs if he coughs or moves too fast.  States he felt the back of his left shoulder and felt a laceration but did not see any bleeding.  He remains ambulatory.  No intervention tried PTA.  Past Medical History:  Diagnosis Date  . Anxiety   . Hypertension   . Migraine   . PCL injury   . Stroke [facility])     Patient Active Problem List   Diagnosis Date Noted  . Seizure (HCC) 07/10/2016  . Hypokalemia 07/10/2016  . History of stroke 07/10/2016  . ADHD 07/10/2016  . Lateral epicondylitis of left elbow 07/10/2016  . Carotid artery disease (HCC) 04/08/2016  . Anxiety 04/08/2016  . Migraine headache 04/08/2016  . HTN (hypertension) 07/11/2012    Past Surgical History:  Procedure Laterality Date  . carotic procedure    . SHOULDER SURGERY Right 03/2014        Home Medications    Prior to Admission medications   Medication Sig Start Date End Date Taking? Authorizing Provider  acetaminophen (TYLENOL) 325 MG tablet Take 325-650 mg by mouth every 6 (six) hours as needed (pain or headaches).    [provider]  ALPRAZolam Prudy Feeler) 0.5 MG tablet Take 0.5 mg by mouth 2 (two) times daily as needed for anxiety.  11/21/13   [provider]    amphetamine-dextroamphetamine (ADDERALL XR) 30 MG 24 hr capsule Take 30 mg by mouth daily.    [provider]  aspirin 325 MG tablet Take 1 tablet (325 mg total) by mouth daily. Patient taking differently: Take 325 mg by mouth daily as needed for headache.  04/09/16   Maretta Bees, MD  aspirin EC 81 MG tablet Take 81 mg by mouth daily.    [provider]  atorvastatin (LIPITOR) 80 MG tablet Take 80 mg by mouth every evening. 04/14/16   [provider]  carvedilol (COREG) 6.25 MG tablet Take 6.25 mg by mouth 2 (two) times daily with a meal.    [provider]  Cholecalciferol (VITAMIN D-1000 MAX ST) 1000 units tablet Take 1,000 Units by mouth daily.    [provider]  clopidogrel (PLAVIX) 75 MG tablet Take 75 mg by mouth daily. 04/14/16   [provider]  Coenzyme Q10 100 MG capsule Take 100 mg by mouth daily. 09/26/16   [provider]  DULoxetine (CYMBALTA) 30 MG capsule Take 30 mg by mouth daily. 08/09/17   [provider]  hydrochlorothiazide (HYDRODIURIL) 25 MG tablet Take 25 mg by mouth daily. 09/13/17   [provider]  levETIRAcetam (KEPPRA) 500 MG tablet Take 1 tablet (500 mg total) by mouth 2 (two) times  daily. Patient not taking: Reported on 08/11/2017 07/11/16   Dorothea Ogle, MD  levETIRAcetam (KEPPRA) 750 MG tablet Take 750 mg by mouth 2 (two) times daily.    [provider]  magnesium oxide (MAG-OX) 400 MG tablet Take 400 mg by mouth every evening. 04/22/16   [provider]  methocarbamol (ROBAXIN) 500 MG tablet Take 1 tablet (500 mg total) by mouth 2 (two) times daily. Patient not taking: Reported on 08/11/2017 02/21/17   Janne Napoleon, NP  olmesartan (BENICAR) 40 MG tablet Take 40 mg by mouth daily. 09/13/17   [provider]  predniSONE (DELTASONE) 10 MG tablet Take 2 tablets (20 mg total) by mouth 2 (two) times daily with a meal. Patient not taking: Reported on 08/11/2017  02/21/17   Janne Napoleon, NP  promethazine (PHENERGAN) 25 MG tablet Take 25 mg by mouth daily as needed (for migraine-related nausea).  04/22/16   [provider]    Family History Family History  Problem Relation Age of Onset  . Heart disease Father     Social History Social History   Tobacco Use  . Smoking status: Never Smoker  . Smokeless tobacco: Never Used  Substance Use Topics  . Alcohol use: Not Currently    Frequency: Never  . Drug use: No     Allergies   Gabapentin; Tramadol; and Valproic acid   Review of Systems Review of Systems  Musculoskeletal: Positive for arthralgias.  All other systems reviewed and are negative.    Physical Exam Updated Vital Signs BP (!) 148/100 (BP Location: Left Arm)   Pulse 80   Temp 98.1 F (36.7 C) (Oral)   Resp 18   SpO2 98%   Physical Exam  Constitutional: He is oriented to person, place, and time. He appears well-developed and well-nourished.  HENT:  Head: Normocephalic and atraumatic.  Mouth/Throat: Oropharynx is clear and moist.  Eyes: Pupils are equal, round, and reactive to light. Conjunctivae and EOM are normal.  Neck: Normal range of motion.  Cardiovascular: Normal rate, regular rhythm and normal heart sounds.  Pulmonary/Chest: Effort normal and breath sounds normal. No stridor. No respiratory distress. He has no wheezes. He has no rhonchi.  Left ribs are overall non-tender, no bruising or deformity, lungs clear bilaterally  Abdominal: Soft. Bowel sounds are normal. There is no tenderness. There is no rebound.  Musculoskeletal: Normal range of motion.  No laceration noted to left scapula or posterior shoulder Left hip with somewhat tender over the greater trochanter but there is no apparent bruising or deformity, no leg shortening, able to flex/extend a the hip without issue  Neurological: He is alert and oriented to person, place, and time.  Skin: Skin is warm and dry.  Psychiatric:  Odd affect   Nursing note and vitals reviewed.    ED Treatments / Results  Labs (all labs ordered are listed, but only abnormal results are displayed) Labs Reviewed - No data to display  EKG None  Radiology Dg Ribs Unilateral W/chest Left  Result Date: 04/02/2018 CLINICAL DATA:  Patient fell off of bed. Laceration to the back of the left shoulder. History of hypertension. Nonsmoker. EXAM: LEFT RIBS AND CHEST - 3+ VIEW COMPARISON:  None. FINDINGS: Normal heart size and pulmonary vascularity. No focal airspace disease or consolidation in the lungs. No blunting of costophrenic angles. No pneumothorax. Mediastinal contours appear intact. Degenerative changes in the spine. Old resection or resorption of the distal right clavicle. Left ribs appear intact. No acute  displaced fractures identified. No focal bone lesion or bone destruction. Soft tissues are unremarkable. IMPRESSION: 1. No evidence of active pulmonary disease. Negative left ribs. Electronically Signed   By: Burman NievesWilliam  Stevens M.D.   On: 04/02/2018 21:04   Dg Hip Unilat W Or Wo Pelvis 2-3 Views Left  Result Date: 04/03/2018 CLINICAL DATA:  Fall from bed last night.  Anterior left hip pain. EXAM: DG HIP (WITH OR WITHOUT PELVIS) 2-3V LEFT COMPARISON:  CT pelvis 01/08/2017 FINDINGS: There is no evidence of hip fracture or dislocation. There is no evidence of arthropathy or other focal bone abnormality. IMPRESSION: Negative. Electronically Signed   By: Gaylyn RongWalter  Liebkemann M.D.   On: 04/03/2018 00:13    Procedures Procedures (including critical care time)  Medications Ordered in ED Medications - No data to display   Initial Impression / Assessment and Plan / ED Course  I have reviewed the triage vital signs and the nursing notes.  Pertinent labs & imaging results that were available during my care of the patient were reviewed by me and considered in my medical decision making (see chart for details).  37 year old male here after a fall from  bed.  Hit his left posterior shoulder on nightstand and fell to the floor on left side.  No head injury or loss of consciousness.  Complains of pain in the left ribs, but more so left hip.  No acute deformities on exam.  He reported laceration to left shoulder blade, however nothing visible on exam.  Vitals are stable.  Lungs are clear.  Imaging is negative.  Suspect contusions.  Recommended symptomatic care at home.  Close follow-up with PCP.  Return here for any new or worsening symptoms.  Final Clinical Impressions(s) / ED Diagnoses   Final diagnoses:  Fall from bed, initial encounter  Acute pain of left shoulder  Left hip pain    ED Discharge Orders    None       Garlon HatchetSanders, Hillarie Harrigan M, PA-C 04/03/18 0139    Shon BatonHorton, Courtney F, MD 04/03/18 [phone]

## 2018-04-02 NOTE — ED Notes (Signed)
No answer x1

## 2018-04-02 NOTE — ED Triage Notes (Signed)
Pt reports he fell off the bed and hit his night stand. Pt reports rib pain on his left side and a laceration on the back of his left shoulder. Pt also reports left hip tenderness.

## 2018-04-03 NOTE — Discharge Instructions (Signed)
Imaging today was normal. Recommend tylenol/motrin for pain. Close follow-up with your primary care doctor. Return here for new concerns.

## 2018-04-13 IMAGING — CT CT HEAD W/O CM
3 of 4 series · 16 of 47 positions shown, 19 images · non-contrast
Comparison: Head CT and brain MRI 04/07/2016

CLINICAL DATA: Seizure.

EXAM:
CT HEAD WITHOUT CONTRAST
TECHNIQUE: Contiguous axial images were obtained from the base of the skull
through the vertex without intravenous contrast.

[Series 201: head w/o, idose (1) · axial · non-contrast · 0.49mm/px · z∈[+97,+237]mm · 10 of 34 slices shown, 13 images]
[im 3/34  brain]
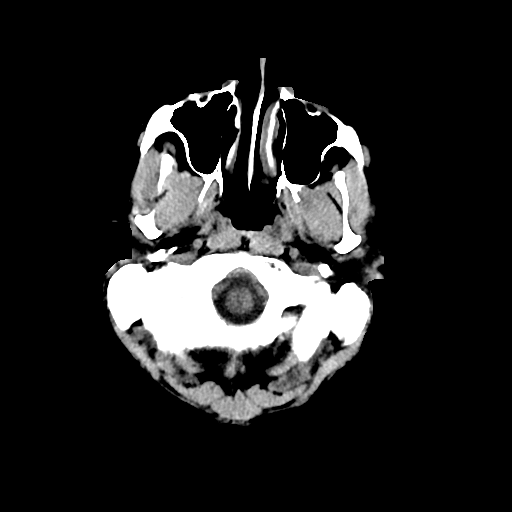
[im 3/34  bone]
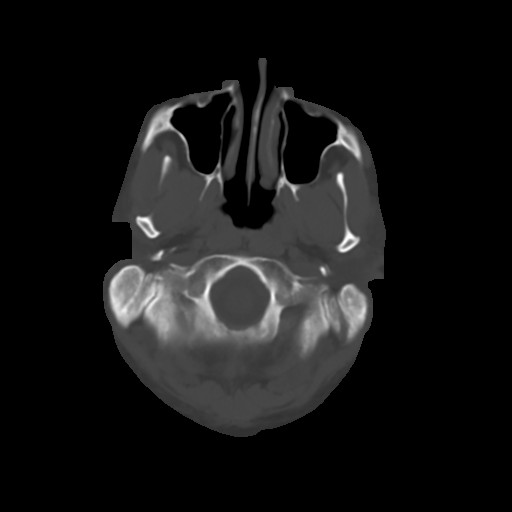
[im 5/34  brain]
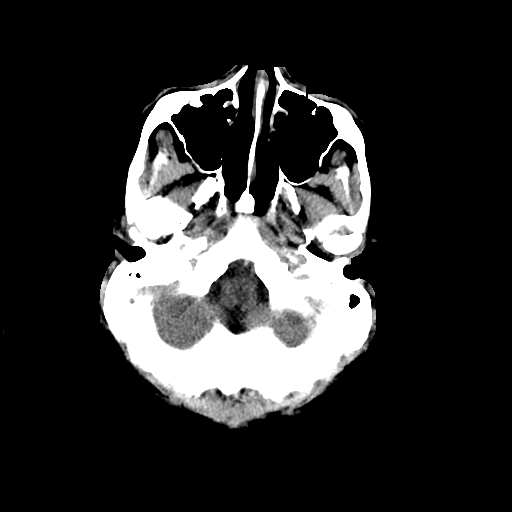
[im 10/34  brain]
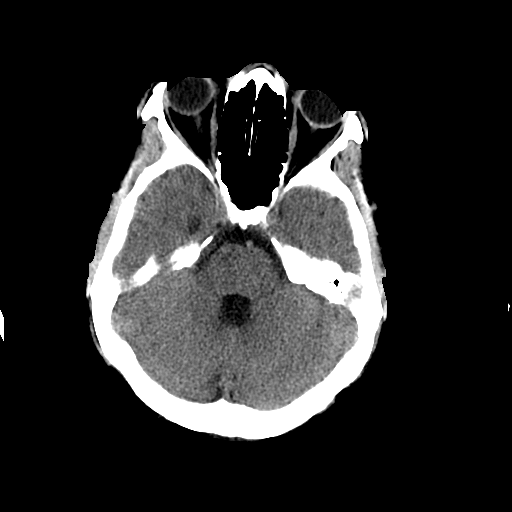
[im 12/34  brain]
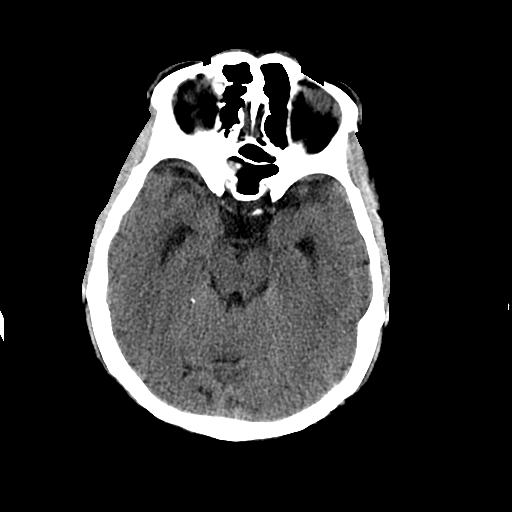
[im 15/34  brain]
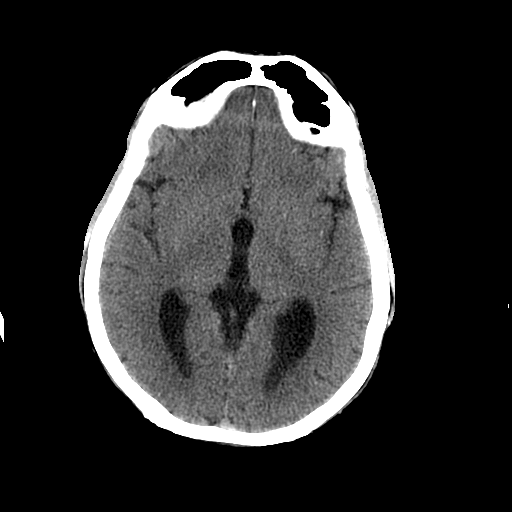
[im 15/34  bone]
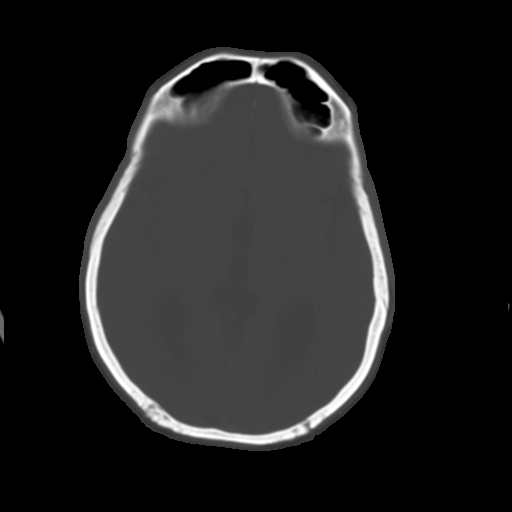
[im 19/34  brain]
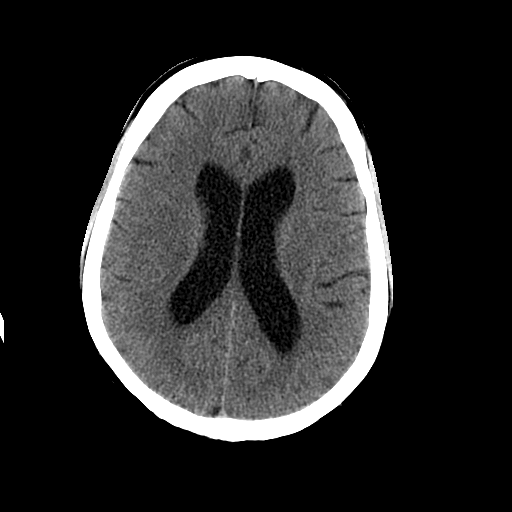
[im 22/34  brain]
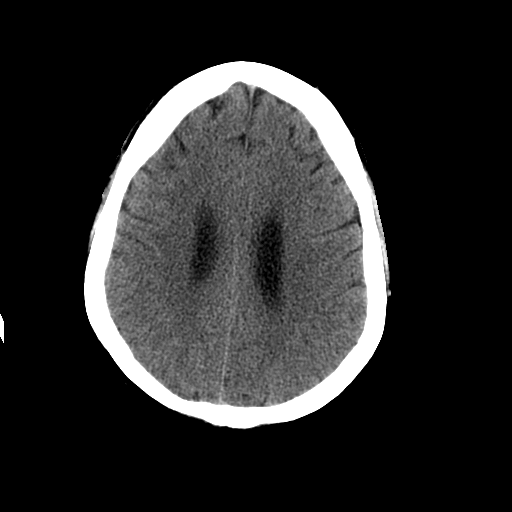
[im 24/34  brain]
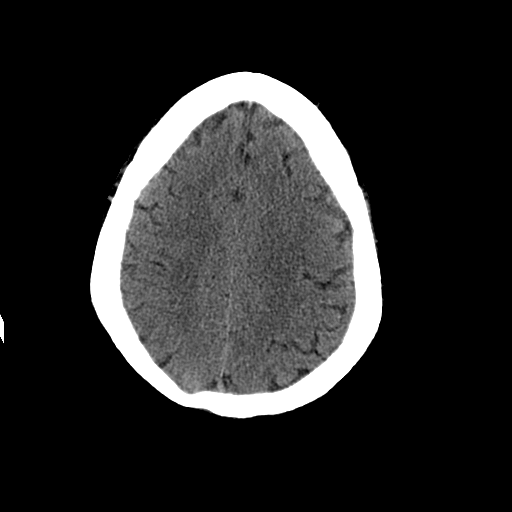
[im 29/34  brain]
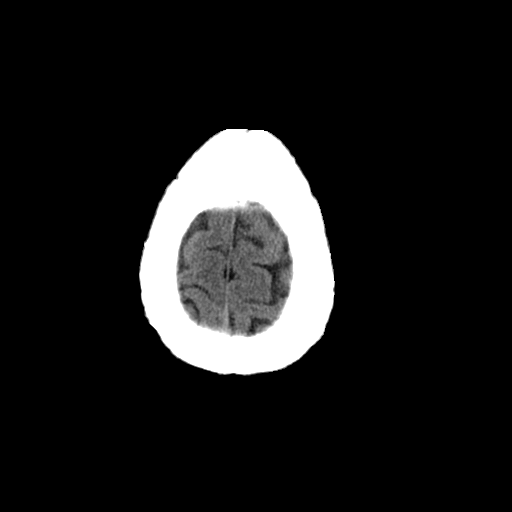
[im 29/34  bone]
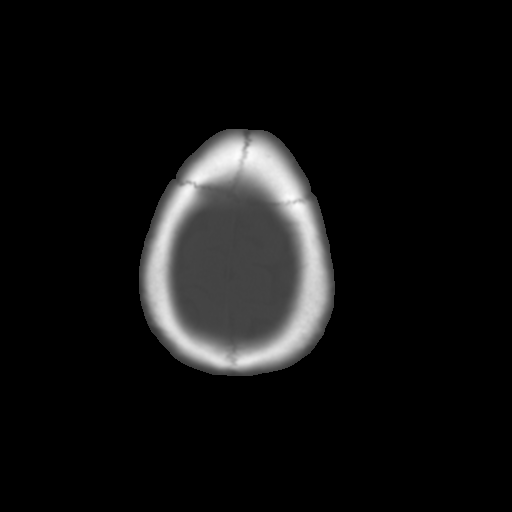
[im 31/34  brain]
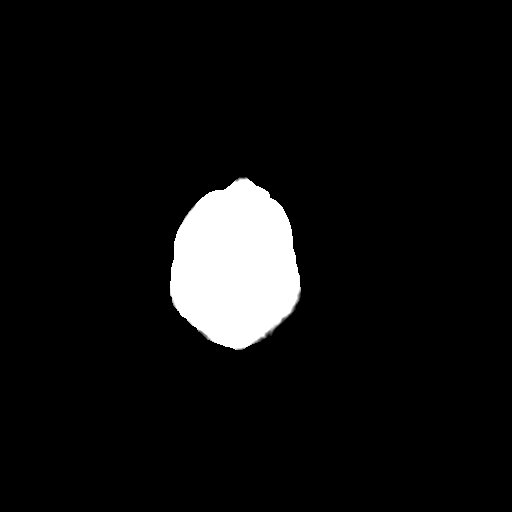

[Series 203: coronal st, idose (1) · coronal · 0.40mm/px · 3 of 79 slices shown]
[im 27/79  brain]
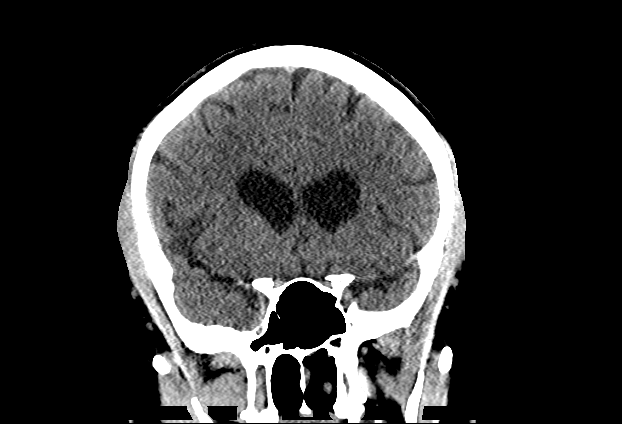
[im 35/79  brain]
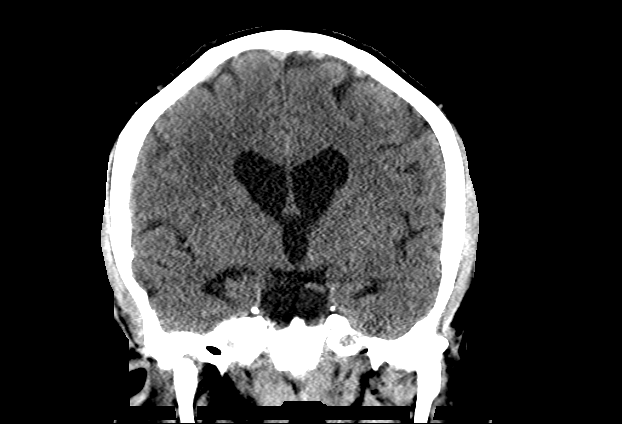
[im 44/79  brain]
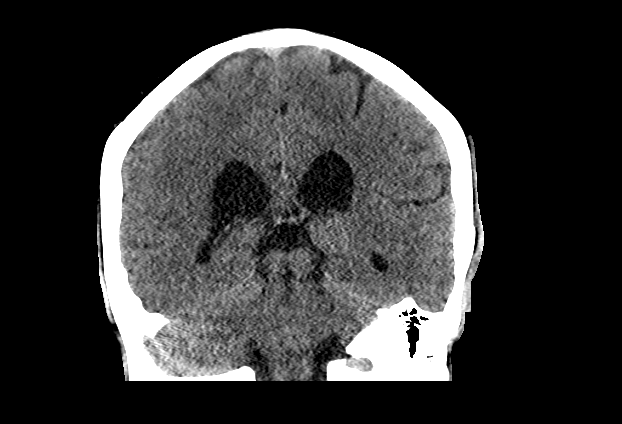

[Series 204: sagittal st, idose (1) · sagittal · 0.40mm/px · 3 of 83 slices shown]
[im 28/83  brain]
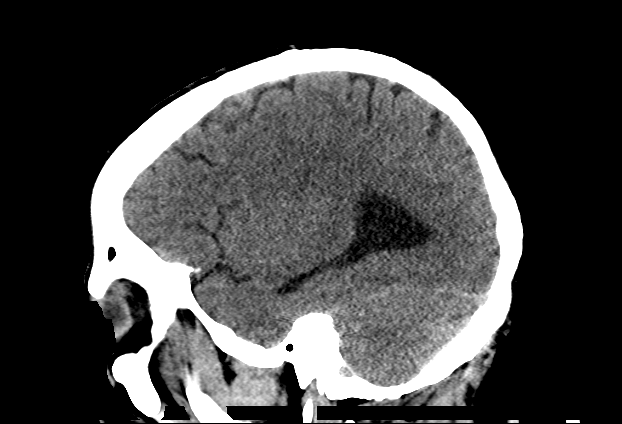
[im 42/83  brain]
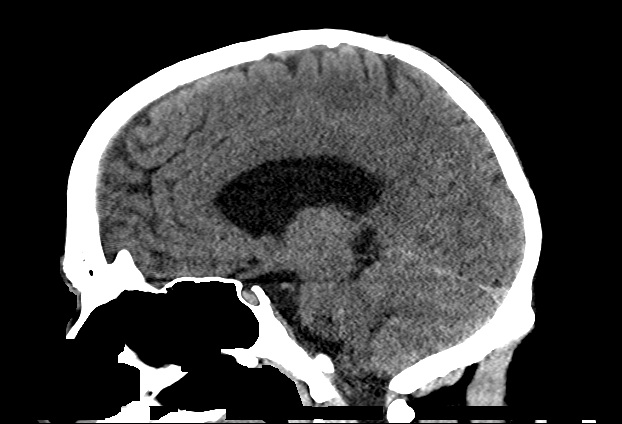
[im 55/83  brain]
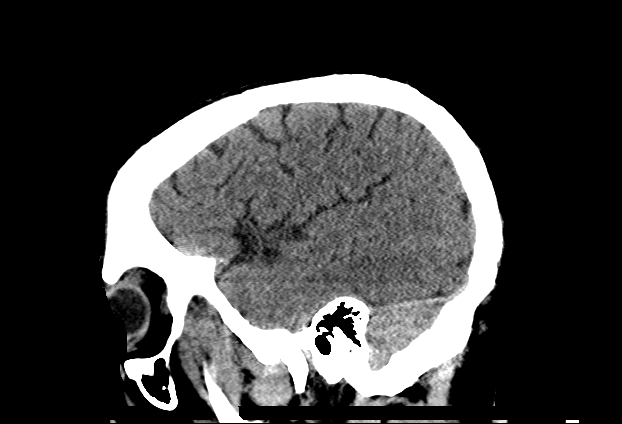

[16 of 47 positions shown; findings below may reference images not displayed]

FINDINGS: Brain: Stable cerebral atrophy and ventriculomegaly. No CT correlate
to the tiny foci of acute infarct on prior MRI. The gray matter
heterotopia seen on MRI is not well seen by CT. No hemorrhage,
evidence of acute ischemia, mass effect or midline shift.

Vascular: No hyperdense vessel or unexpected calcification.

Skull: Normal. Negative for fracture or focal lesion.

Sinuses/Orbits: Mucous retention cysts in the left maxillary sinus.
Paranasal sinuses are otherwise clear. Mastoid air cells are well
aerated. Orbits appear unremarkable.

Other: None.
IMPRESSION: 1.  No acute intracranial abnormality.
2. Stable generalized atrophy. No CT correlate to the small
subcentimeter infarcts on prior MRI.

## 2019-05-02 ENCOUNTER — Ambulatory Visit: Payer: Medicaid Other | Attending: Internal Medicine

## 2019-05-02 DIAGNOSIS — Z20822 Contact with and (suspected) exposure to covid-19: Secondary | ICD-10-CM

## 2019-05-04 LAB — NOVEL CORONAVIRUS, NAA: SARS-CoV-2, NAA: NOT DETECTED

## 2019-06-06 ENCOUNTER — Ambulatory Visit: Payer: Medicaid Other | Attending: Internal Medicine

## 2019-06-06 DIAGNOSIS — Z20822 Contact with and (suspected) exposure to covid-19: Secondary | ICD-10-CM

## 2019-06-07 LAB — NOVEL CORONAVIRUS, NAA: SARS-CoV-2, NAA: NOT DETECTED

## 2019-07-20 ENCOUNTER — Ambulatory Visit: Payer: BLUE CROSS/BLUE SHIELD | Attending: Internal Medicine

## 2019-07-20 DIAGNOSIS — Z23 Encounter for immunization: Secondary | ICD-10-CM

## 2019-07-20 NOTE — Progress Notes (Signed)
   Covid-19 Vaccination Clinic  Name:  Edwin Roberson    MRN: [id number] DOB: 1980/05/15  07/20/2019  [name] was observed post Covid-19 immunization for 15 minutes without incident. He was provided with Vaccine Information Sheet and instruction to access the V-Safe system.   [name] was instructed to call 911 with any severe reactions post vaccine: Marland Kitchen Difficulty breathing  . Swelling of face and throat  . A fast heartbeat  . A bad rash all over body  . Dizziness and weakness   Immunizations Administered    Name Date Dose VIS Date Route   Pfizer COVID-19 Vaccine 07/20/2019 11:04 AM 0.3 mL 04/21/2019 Intramuscular   Manufacturer: [employer], [employer]   Lot: [id number]   NDC: [address]-3

## 2019-08-14 ENCOUNTER — Ambulatory Visit: Payer: BLUE CROSS/BLUE SHIELD | Attending: Internal Medicine

## 2019-08-14 DIAGNOSIS — Z23 Encounter for immunization: Secondary | ICD-10-CM

## 2019-08-14 NOTE — Progress Notes (Signed)
   Covid-19 Vaccination Clinic  Name:  Edwin Roberson    MRN: [id number] DOB: 1980/12/27  08/14/2019  Mr. [name] was observed post Covid-19 immunization for 15 minutes without incident. He was provided with Vaccine Information Sheet and instruction to access the V-Safe system.   Mr. [name] was instructed to call 911 with any severe reactions post vaccine: Marland Kitchen Difficulty breathing  . Swelling of face and throat  . A fast heartbeat  . A bad rash all over body  . Dizziness and weakness   Immunizations Administered    Name Date Dose VIS Date Route   Pfizer COVID-19 Vaccine 08/14/2019 10:15 AM 0.3 mL 04/21/2019 Intramuscular   Manufacturer: [employer], [employer]   Lot: [id number]   NDC: [address]-9

## 2021-12-24 LAB — GLUCOSE, POCT (MANUAL RESULT ENTRY): POC Glucose: 109 mg/dl — AB (ref 70–99)

## 2021-12-24 LAB — POCT GLYCOSYLATED HEMOGLOBIN (HGB A1C): Hemoglobin-A1c: 5.1

## 2022-07-22 LAB — HEMOGLOBIN A1C: Hemoglobin A1C: 5.4

## 2022-07-22 LAB — AMB RESULTS CONSOLE CBG: Glucose: 112

## 2022-07-22 NOTE — Progress Notes (Signed)
Pt is not fasting
